# Patient Record
Sex: Male | Born: 1990 | Race: White | Hispanic: No | Marital: Married | Smoking: Never smoker
Health system: Southern US, Community
[De-identification: ages and names within clinical notes are randomized; demographics above are authoritative.]

## PROBLEM LIST (undated history)

## (undated) DIAGNOSIS — K529 Noninfective gastroenteritis and colitis, unspecified: Secondary | ICD-10-CM

## (undated) DIAGNOSIS — L80 Vitiligo: Secondary | ICD-10-CM

## (undated) HISTORY — PX: HERNIA REPAIR: SHX51

## (undated) HISTORY — DX: Noninfective gastroenteritis and colitis, unspecified: K52.9

## (undated) HISTORY — DX: Vitiligo: L80

## (undated) HISTORY — PX: KNEE SURGERY: SHX244

---

## 2016-08-28 ENCOUNTER — Emergency Department (HOSPITAL_COMMUNITY): Payer: BLUE CROSS/BLUE SHIELD

## 2016-08-28 ENCOUNTER — Encounter (HOSPITAL_COMMUNITY): Payer: Self-pay | Admitting: Emergency Medicine

## 2016-08-28 DIAGNOSIS — K219 Gastro-esophageal reflux disease without esophagitis: Secondary | ICD-10-CM | POA: Diagnosis not present

## 2016-08-28 DIAGNOSIS — R079 Chest pain, unspecified: Secondary | ICD-10-CM | POA: Diagnosis present

## 2016-08-28 LAB — CBC
HCT: 41.3 % (ref 39.0–52.0)
Hemoglobin: 14.7 g/dL (ref 13.0–17.0)
MCH: 29.7 pg (ref 26.0–34.0)
MCHC: 35.6 g/dL (ref 30.0–36.0)
MCV: 83.4 fL (ref 78.0–100.0)
PLATELETS: 254 10*3/uL (ref 150–400)
RBC: 4.95 MIL/uL (ref 4.22–5.81)
RDW: 11.6 % (ref 11.5–15.5)
WBC: 10.2 10*3/uL (ref 4.0–10.5)

## 2016-08-28 LAB — I-STAT TROPONIN, ED: TROPONIN I, POC: 0 ng/mL (ref 0.00–0.08)

## 2016-08-28 IMAGING — CR DG CHEST 2V
2 series · 2 of 2 positions shown · non-contrast
Comparison: None.

CLINICAL DATA: Chest pain

EXAM:
CHEST  2 VIEW

[chest pa]
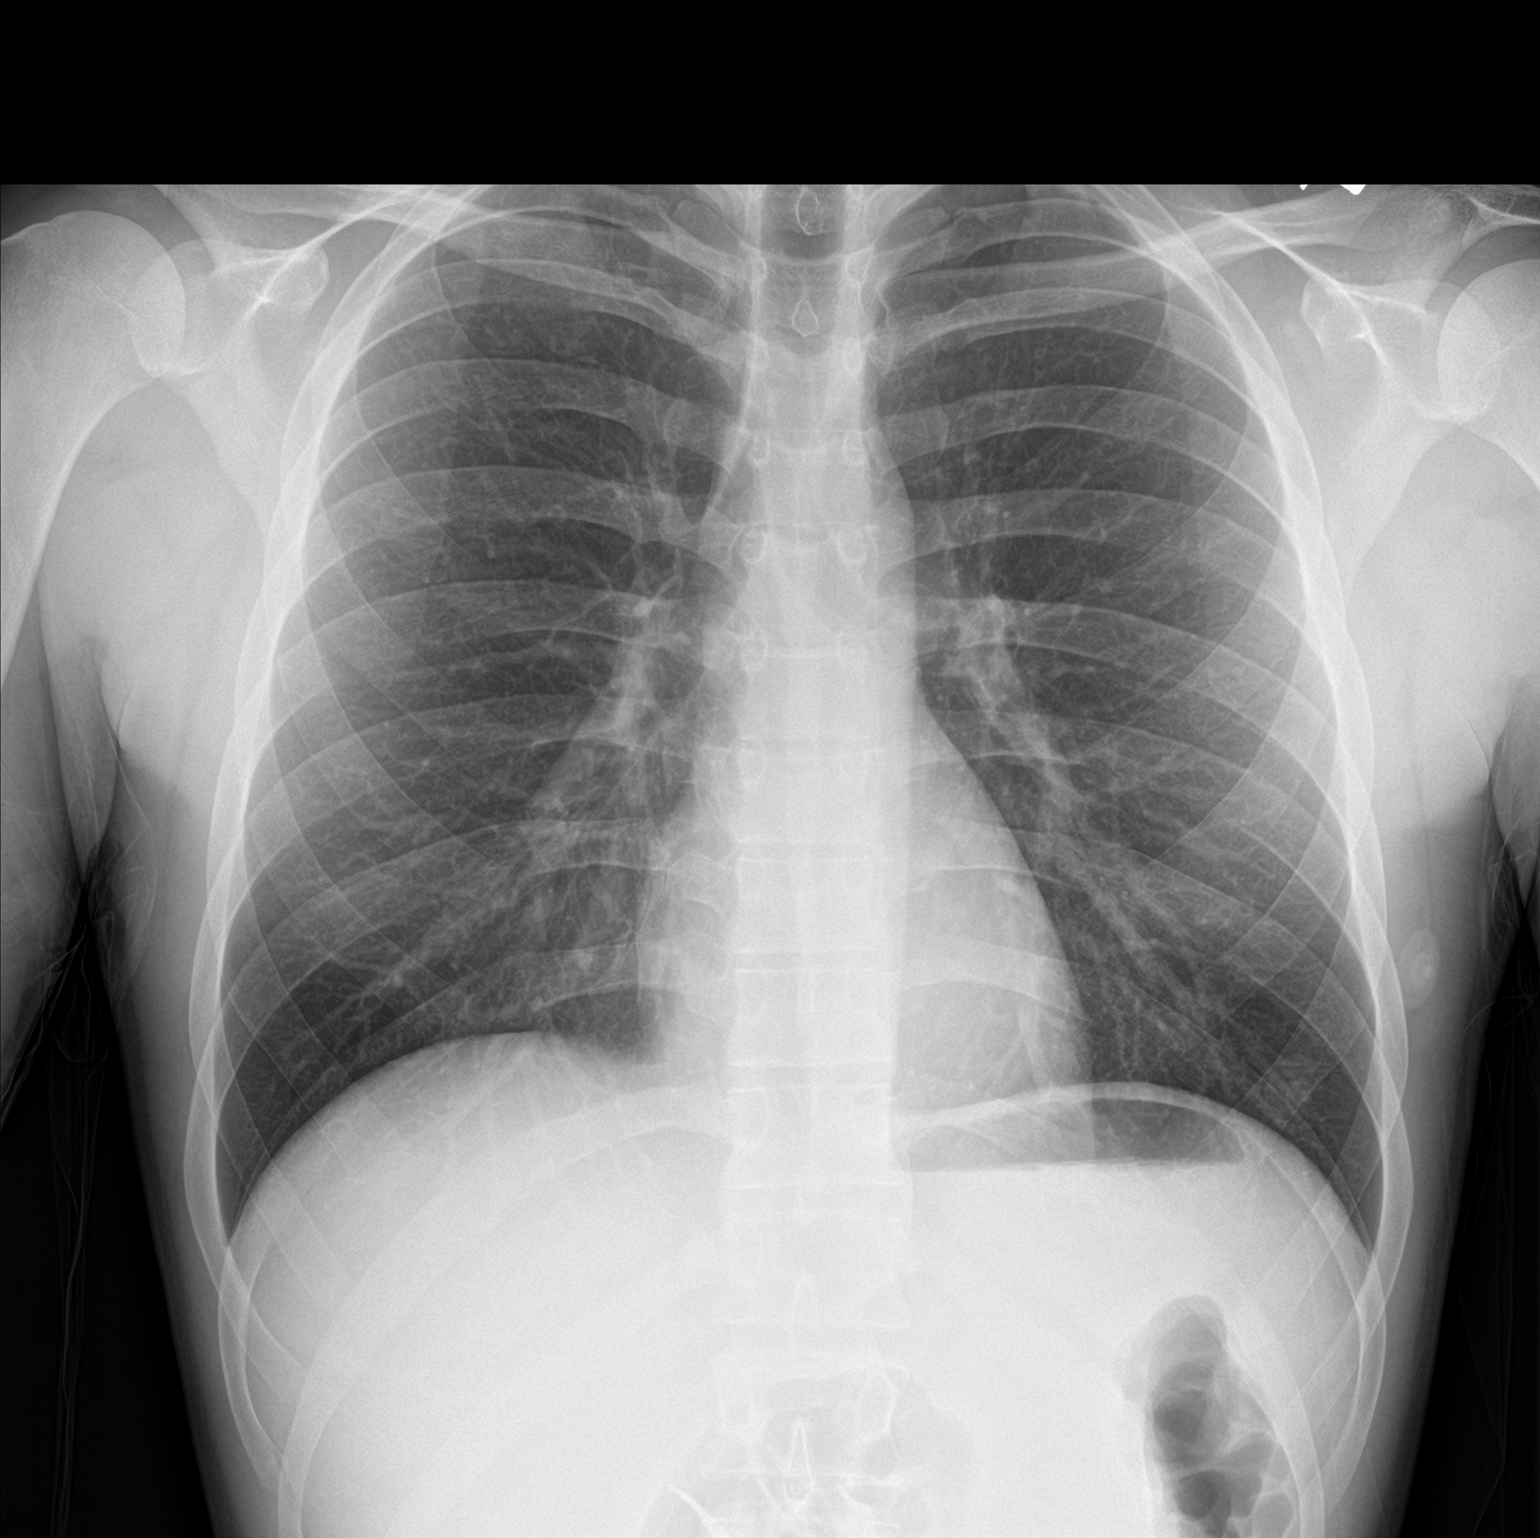

[chest lat]
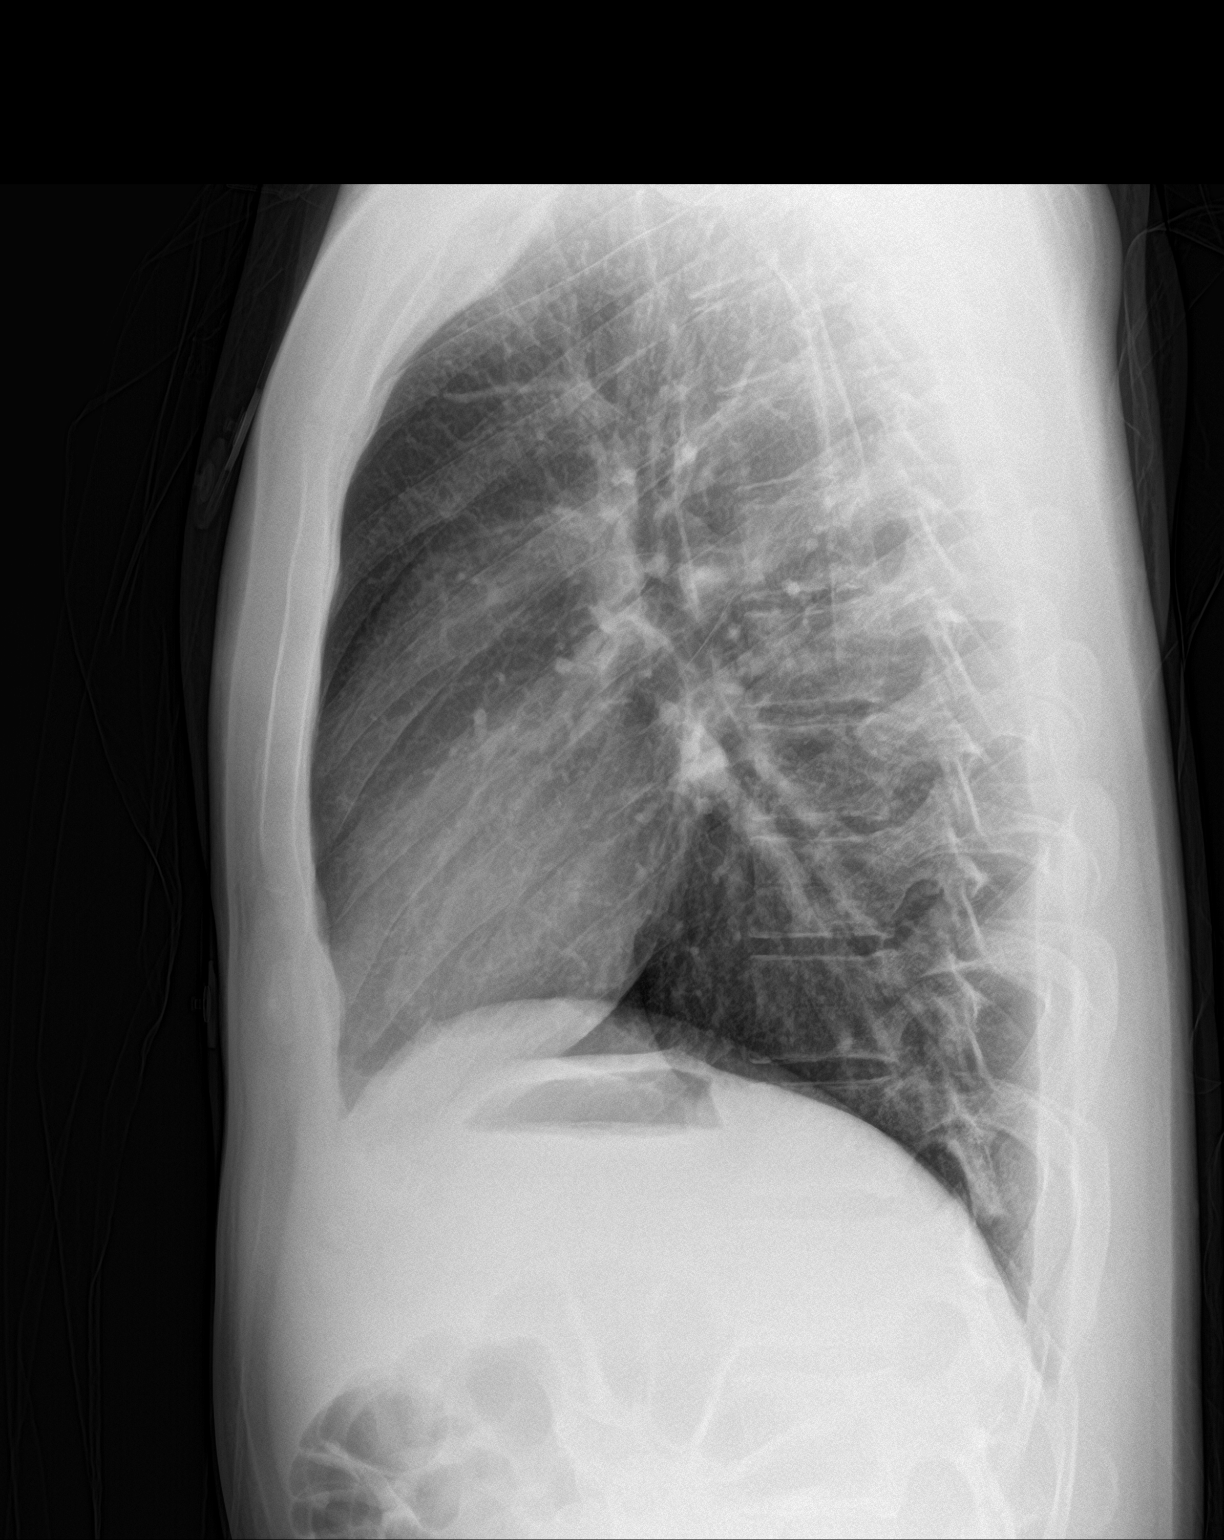

[2 of 2 positions shown; findings below may reference images not displayed]

FINDINGS: Cardiomediastinal contours are normal. No pneumothorax or pleural
effusion.

No focal airspace consolidation or pulmonary edema.
IMPRESSION: Clear lungs.

## 2016-08-28 NOTE — ED Triage Notes (Signed)
Patient reports left chest pain " burning" onset this evening with SOB and nausea , denies diaphoresis . No cough or fever .

## 2016-08-29 ENCOUNTER — Emergency Department (HOSPITAL_COMMUNITY)
Admission: EM | Admit: 2016-08-29 | Discharge: 2016-08-29 | Disposition: A | Discharge status: Home / Self Care | Payer: BLUE CROSS/BLUE SHIELD | Attending: Emergency Medicine | Admitting: Emergency Medicine

## 2016-08-29 DIAGNOSIS — R079 Chest pain, unspecified: Secondary | ICD-10-CM

## 2016-08-29 DIAGNOSIS — K219 Gastro-esophageal reflux disease without esophagitis: Secondary | ICD-10-CM

## 2016-08-29 LAB — BASIC METABOLIC PANEL
ANION GAP: 13 (ref 5–15)
BUN: 11 mg/dL (ref 6–20)
CALCIUM: 9.7 mg/dL (ref 8.9–10.3)
CO2: 26 mmol/L (ref 22–32)
Chloride: 96 mmol/L — ABNORMAL LOW (ref 101–111)
Creatinine, Ser: 1.01 mg/dL (ref 0.61–1.24)
GFR calc Af Amer: >60 mL/min (ref >60)
GFR calc non Af Amer: >60 mL/min (ref >60)
GLUCOSE: 103 mg/dL — AB (ref 65–99)
POTASSIUM: 3.7 mmol/L (ref 3.5–5.1)
Sodium: 135 mmol/L (ref 135–145)

## 2016-08-29 LAB — I-STAT TROPONIN, ED: Troponin i, poc: 0 ng/mL (ref 0.00–0.08)

## 2016-08-29 MED ORDER — ALUM & MAG HYDROXIDE-SIMETH 200-200-20 MG/5ML PO SUSP
30.0000 mL | Freq: Once | ORAL | Status: AC
  Administered 2016-08-29: 30 mL via ORAL
  Filled 2016-08-29: qty 30

## 2016-08-29 MED ORDER — OMEPRAZOLE 20 MG PO CPDR: 20.0000 mg | DELAYED_RELEASE_CAPSULE | Freq: Every day | ORAL | 0 refills | Status: DC

## 2016-08-29 NOTE — ED Provider Notes (Signed)
[facility] Provider Note   CSN: [id number] Arrival date & time: 08/28/16  2304     History   Chief Complaint Chief Complaint  Patient presents with  . Chest Pain    HPI Matthew Camacho is a 25 y.o. male.  HPI Patient reports constant burning left-sided chest pain since 9 PM.  He's never had symptoms like this before.  There is a family history of early cardiac disease but no one in their 220s had heart disease.  No history DVT or pulmonary embolism.  Patient reports no recent cough or congestion.  He states that his discomfort is worse with lying flat.  He tried Pepcid at home without improvement in his symptoms and could not kick comfortable through the night thus he came to the ER for evaluation.  He is active and walks everyday and never develops chest pain when walking.   History reviewed. No pertinent past medical history.  There are no active problems to display for this patient.   Past Surgical History:  Procedure Laterality Date  . HERNIA REPAIR         Home Medications    Prior to Admission medications   Not on File    Family History No family history on file.  Social History Social History  Substance Use Topics  . Smoking status: Never Smoker  . Smokeless tobacco: Never Used  . Alcohol use No     Allergies   Codeine   Review of Systems Review of Systems  All other systems reviewed and are negative.    Physical Exam Updated Vital Signs BP 146/84   Pulse 77   Temp 97.4 F (36.3 C) (Oral)   Resp 19   Ht 6' (1.829 m)   Wt 167 lb 8 oz (76 kg)   SpO2 100%   BMI 22.72 kg/m   Physical Exam  Constitutional: He is oriented to person, place, and time. He appears well-developed and well-nourished.  HENT:  Head: Normocephalic and atraumatic.  Eyes: EOM are normal.  Neck: Normal range of motion.  Cardiovascular: Normal rate, regular rhythm, normal heart sounds and intact distal pulses.   Pulmonary/Chest: Effort normal and breath  sounds normal. No respiratory distress.  Abdominal: Soft. He exhibits no distension. There is no tenderness.  Musculoskeletal: Normal range of motion.  Neurological: He is alert and oriented to person, place, and time.  Skin: Skin is warm and dry.  Psychiatric: He has a normal mood and affect. Judgment normal.  Nursing note and vitals reviewed.    ED Treatments / Results  Labs (all labs ordered are listed, but only abnormal results are displayed) Labs Reviewed  BASIC METABOLIC PANEL - Abnormal; Notable for the following:       Result Value   Chloride 96 (*)    Glucose, Bld 103 (*)    All other components within normal limits  CBC  I-STAT TROPOININ, ED  I-STAT TROPOININ, ED    EKG  EKG Interpretation #1  Date/Time:  Wednesday August 28 2016 23:08:43 EST Ventricular Rate:  67 PR Interval:  122 QRS Duration: 116 QT Interval:  368 QTC Calculation: 388 R Axis:   83 Text Interpretation:  Normal sinus rhythm Nonspecific ST abnormality Abnormal ECG No old tracing to compare Confirmed by Hanifa Antonetti  MD, Irineo Gaulin ([id number]) on 08/29/2016 5:05:20 AM        EKG Interpretation#2  Date/Time:  Thursday August 29 2016 05:13:41 EST Ventricular Rate:  61 PR Interval:  122 QRS Duration: 107 QT  Interval:  389 QTC Calculation: 392 R Axis:   82 Text Interpretation:  Sinus rhythm Atrial premature complexes in couplets RSR' in V1 or V2, probably normal variant Nonspecific T abnrm, anterolateral leads ST elev, probable normal early repol pattern No significant change was found Confirmed by Bonetta Mostek  MD, Ralph Benavidez ([id number]) on 08/29/2016 5:54:19 AM         Radiology Dg Chest 2 View  Result Date: 08/28/2016 CLINICAL DATA:  Chest pain EXAM: CHEST  2 VIEW COMPARISON:  None. FINDINGS: Cardiomediastinal contours are normal. No pneumothorax or pleural effusion. No focal airspace consolidation or pulmonary edema. IMPRESSION: Clear lungs. Electronically Signed   By: Deatra RobinsonKevin  Herman M.D.   On: 08/28/2016  23:35    Procedures Procedures (including critical care time)  Medications Ordered in ED Medications  alum & mag hydroxide-simeth (MAALOX/MYLANTA) 200-200-20 MG/5ML suspension 30 mL (30 mLs Oral Given 08/29/16 0509)     Initial Impression / Assessment and Plan / ED Course  I have reviewed the triage vital signs and the nursing notes.  Pertinent labs & imaging results that were available during my care of the patient were reviewed by me and considered in my medical decision making (see chart for details).  Clinical Course     Feels better after Maalox.  Patient be placed on daily Prilosec for a week.  Recommended Tums and Maalox at home for symptoms.  Doubt ACS.  Doubt PE.  Troponin negative 2.  EKG 2 without ischemic changes.  Patient is PERC negative  Final Clinical Impressions(s) / ED Diagnoses   Final diagnoses:  Chest pain, unspecified type  Gastroesophageal reflux disease, esophagitis presence not specified    New Prescriptions Discharge Medication List as of 08/29/2016  6:19 AM    START taking these medications   Details  omeprazole (PRILOSEC) 20 MG capsule Take 1 capsule (20 mg total) by mouth daily., Starting Thu 08/29/2016, Print         Azalia BilisKevin Dorwin Fitzhenry, MD 08/29/16 [id number]

## 2016-08-29 NOTE — ED Notes (Signed)
Pt verbalized understanding of d/c instructions and has no further questions. Pt is stable, A&Ox4, VSS.  

## 2018-02-16 ENCOUNTER — Encounter (HOSPITAL_COMMUNITY): Payer: Self-pay | Admitting: Emergency Medicine

## 2018-02-16 ENCOUNTER — Ambulatory Visit (HOSPITAL_COMMUNITY)
Admission: EM | Admit: 2018-02-16 | Discharge: 2018-02-16 | Disposition: A | Discharge status: Home / Self Care | Payer: Commercial Managed Care - PPO | Attending: Internal Medicine | Admitting: Internal Medicine

## 2018-02-16 DIAGNOSIS — Z885 Allergy status to narcotic agent status: Secondary | ICD-10-CM | POA: Diagnosis not present

## 2018-02-16 DIAGNOSIS — R197 Diarrhea, unspecified: Secondary | ICD-10-CM | POA: Diagnosis not present

## 2018-02-16 DIAGNOSIS — Z79899 Other long term (current) drug therapy: Secondary | ICD-10-CM | POA: Insufficient documentation

## 2018-02-16 LAB — CBC
HEMATOCRIT: 42.1 % (ref 39.0–52.0)
Hemoglobin: 14.3 g/dL (ref 13.0–17.0)
MCH: 29.3 pg (ref 26.0–34.0)
MCHC: 34 g/dL (ref 30.0–36.0)
MCV: 86.3 fL (ref 78.0–100.0)
PLATELETS: 330 10*3/uL (ref 150–400)
RBC: 4.88 MIL/uL (ref 4.22–5.81)
RDW: 11.7 % (ref 11.5–15.5)
WBC: 8 10*3/uL (ref 4.0–10.5)

## 2018-02-16 LAB — COMPREHENSIVE METABOLIC PANEL
ALBUMIN: 4 g/dL (ref 3.5–5.0)
ALT: 20 U/L (ref 17–63)
ANION GAP: 8 (ref 5–15)
AST: 18 U/L (ref 15–41)
Alkaline Phosphatase: 65 U/L (ref 38–126)
BILIRUBIN TOTAL: 0.7 mg/dL (ref 0.3–1.2)
BUN: 13 mg/dL (ref 6–20)
CHLORIDE: 103 mmol/L (ref 101–111)
CO2: 30 mmol/L (ref 22–32)
Calcium: 9.4 mg/dL (ref 8.9–10.3)
Creatinine, Ser: 1.19 mg/dL (ref 0.61–1.24)
GFR calc Af Amer: >60 mL/min (ref >60)
GFR calc non Af Amer: >60 mL/min (ref >60)
GLUCOSE: 92 mg/dL (ref 65–99)
POTASSIUM: 4.4 mmol/L (ref 3.5–5.1)
SODIUM: 141 mmol/L (ref 135–145)
Total Protein: 7 g/dL (ref 6.5–8.1)

## 2018-02-16 LAB — POCT I-STAT, CHEM 8
BUN: 15 mg/dL (ref 6–20)
CHLORIDE: 100 mmol/L — AB (ref 101–111)
Calcium, Ion: 1.27 mmol/L (ref 1.15–1.40)
Creatinine, Ser: 1.1 mg/dL (ref 0.61–1.24)
Glucose, Bld: 89 mg/dL (ref 65–99)
HCT: 41 % (ref 39.0–52.0)
HEMOGLOBIN: 13.9 g/dL (ref 13.0–17.0)
POTASSIUM: 4.4 mmol/L (ref 3.5–5.1)
SODIUM: 140 mmol/L (ref 135–145)
TCO2: 29 mmol/L (ref 22–32)

## 2018-02-16 LAB — C DIFFICILE QUICK SCREEN W PCR REFLEX
C DIFFICILE (CDIFF) INTERP: NOT DETECTED
C Diff antigen: NEGATIVE
C Diff toxin: NEGATIVE

## 2018-02-16 NOTE — ED Triage Notes (Signed)
Pt states that he has ongoing diarrhea for the past two weeks and that within the past couple of days it has gotten progressively worse.

## 2018-02-16 NOTE — ED Provider Notes (Signed)
[facility]    CSN: [id number] Arrival date & time: 02/16/18  1802     History   Chief Complaint Chief Complaint  Patient presents with  . Diarrhea    HPI Matthew Camacho is a 27 y.o. male.   Ed[place] presents with complaints of diarrhea which has been present for 2.5 weeks now. Has become more urgent. Has approximately 3-5 episodes a day of liquid stool. Over the past 3 days has had some bright red blood. States about 2 out of 3 stools have some blood. Abdominal pain at time of stool but otherwise no abdominal pain. Eating BRAT diet, drinking. No nausea or vomiting. No fever. Has had night sweats a few times. No recent travel or questionable intake. No recent surgery or antibiotic use. No known ill contacts. Wife does work in Teacher, music. Has been taking imodium which slows it down but has not stopped stooling. Denies any previous similar episodes. Hernia repair in the past. No other abdominal surgeries.    ROS per HPI.      History reviewed. No pertinent past medical history.  There are no active problems to display for this patient.   Past Surgical History:  Procedure Laterality Date  . HERNIA REPAIR         Home Medications    Prior to Admission medications   Medication Sig Start Date End Date Taking? Authorizing Provider  omeprazole (PRILOSEC) 20 MG capsule Take 1 capsule (20 mg total) by mouth daily. 08/29/16   Azalia Bilis, MD    Family History No family history on file.  Social History Social History   Tobacco Use  . Smoking status: Never Smoker  . Smokeless tobacco: Never Used  Substance Use Topics  . Alcohol use: No  . Drug use: No     Allergies   Codeine   Review of Systems Review of Systems   Physical Exam Triage Vital Signs ED Triage Vitals  Enc Vitals Group     BP 02/16/18 1911 111/61     Pulse Rate 02/16/18 1911 72     Resp 02/16/18 1911 20     Temp 02/16/18 1911 98.1 F (36.7 C)     Temp Source 02/16/18 1911 Oral       SpO2 02/16/18 1911 100 %     Weight --      Height --      Head Circumference --      Peak Flow --      Pain Score 02/16/18 1903 4     Pain Loc --      Pain Edu? --      Excl. in GC? --    No data found.  Updated Vital Signs BP 111/61 (BP Location: Left Arm)   Pulse 72   Temp 98.1 F (36.7 C) (Oral)   Resp 20   SpO2 100%    Physical Exam  Constitutional: He is oriented to person, place, and time. He appears well-developed and well-nourished.  Cardiovascular: Normal rate and regular rhythm.  Pulmonary/Chest: Effort normal and breath sounds normal.  Abdominal: Soft. He exhibits no distension. Bowel sounds are increased. There is no hepatosplenomegaly, splenomegaly or hepatomegaly. There is no tenderness. There is no rigidity, no rebound, no guarding, no CVA tenderness, no tenderness at McBurney's point and negative Murphy's sign.  Neurological: He is alert and oriented to person, place, and time.  Skin: Skin is warm and dry.     UC Treatments / Results  Labs (all labs ordered  are listed, but only abnormal results are displayed) Labs Reviewed  POCT I-STAT, CHEM 8 - Abnormal; Notable for the following components:      Result Value   Chloride 100 (*)    All other components within normal limits  C DIFFICILE QUICK SCREEN W PCR REFLEX  GASTROINTESTINAL PANEL BY PCR, STOOL (REPLACES STOOL CULTURE)  CBC  COMPREHENSIVE METABOLIC PANEL    EKG None  Radiology No results found.  Procedures Procedures (including critical care time)  Medications Ordered in UC Medications - No data to display  Initial Impression / Assessment and Plan / UC Course  I have reviewed the triage vital signs and the nursing notes.  Pertinent labs & imaging results that were available during my care of the patient were reviewed by me and considered in my medical decision making (see chart for details).     Non toxic in appearance. Afebrile. Without acute abdominal findings on exam.  Taking po, urinating. Recently with BRB to stool but no specific known risk factors for infectious diarrhea. Fissure? Stool culture obtained and cdiff. Cbc, cmp collected and pending. Opted to hold off on empiric antibiotics pending culture results at this time. Chem 8 reassuring. Will notify of any positive findings and if any changes to treatment are needed.  Encouraged follow up with GI. Return precautions provided. Patient verbalized understanding and agreeable to plan.   Final Clinical Impressions(s) / UC Diagnoses   Final diagnoses:  Diarrhea, unspecified type     Discharge Instructions     We will call you with any concerning or new findings, you may also check on your MyChart. Your Electrolytes look well today so as you said- you are maintaining hydration which is improving. Continue to drink plenty of fluids, BRAT diet as tolerated. May continue with imodium.  I would recommend establishing with GI no matter as results are still pending.  If develop worsening of pain, fevers, weakness, light headed or otherwise worsening please go to Er.      ED Prescriptions    None     Controlled Substance Prescriptions [place] Controlled Substance Registry consulted? Not Applicable   Georgetta HaberBurky, Price Lachapelle B, NP 02/16/18 2014

## 2018-02-16 NOTE — Discharge Instructions (Addendum)
We will call you with any concerning or new findings, you may also check on your MyChart. Your Electrolytes look well today so as you said- you are maintaining hydration which is improving. Continue to drink plenty of fluids, BRAT diet as tolerated. May continue with imodium.  I would recommend establishing with GI no matter as results are still pending.  If develop worsening of pain, fevers, weakness, light headed or otherwise worsening please go to Er.

## 2018-02-17 ENCOUNTER — Telehealth (HOSPITAL_COMMUNITY): Payer: Self-pay

## 2018-02-17 LAB — GASTROINTESTINAL PANEL BY PCR, STOOL (REPLACES STOOL CULTURE)
ADENOVIRUS F40/41: NOT DETECTED
ASTROVIRUS: NOT DETECTED
Campylobacter species: NOT DETECTED
Cryptosporidium: NOT DETECTED
Cyclospora cayetanensis: NOT DETECTED
ENTAMOEBA HISTOLYTICA: NOT DETECTED
ENTEROAGGREGATIVE E COLI (EAEC): NOT DETECTED
ENTEROTOXIGENIC E COLI (ETEC): NOT DETECTED
Enteropathogenic E coli (EPEC): NOT DETECTED
GIARDIA LAMBLIA: NOT DETECTED
NOROVIRUS GI/GII: NOT DETECTED
Plesimonas shigelloides: NOT DETECTED
Rotavirus A: NOT DETECTED
Salmonella species: NOT DETECTED
Sapovirus (I, II, IV, and V): NOT DETECTED
Shiga like toxin producing E coli (STEC): NOT DETECTED
Shigella/Enteroinvasive E coli (EIEC): NOT DETECTED
Vibrio cholerae: NOT DETECTED
Vibrio species: NOT DETECTED
Yersinia enterocolitica: NOT DETECTED

## 2018-02-17 NOTE — Telephone Encounter (Signed)
All results are within normal. Pt called and made aware and given instructions per Dorene GrebeNatalie NP.

## 2019-03-26 ENCOUNTER — Ambulatory Visit: Payer: Commercial Managed Care - PPO | Admitting: Family Medicine

## 2019-04-06 ENCOUNTER — Ambulatory Visit: Payer: Commercial Managed Care - PPO | Admitting: Family Medicine

## 2019-04-06 ENCOUNTER — Encounter: Payer: Self-pay | Admitting: Family Medicine

## 2019-04-06 ENCOUNTER — Other Ambulatory Visit: Payer: Self-pay

## 2019-04-06 VITALS — BP 110/62 | HR 62 | Ht 72.0 in | Wt 157.0 lb

## 2019-04-06 DIAGNOSIS — K529 Noninfective gastroenteritis and colitis, unspecified: Secondary | ICD-10-CM | POA: Diagnosis not present

## 2019-04-06 DIAGNOSIS — Z0000 Encounter for general adult medical examination without abnormal findings: Secondary | ICD-10-CM | POA: Diagnosis not present

## 2019-04-06 NOTE — Assessment & Plan Note (Signed)
Currently well controlled.  Ambulatory referral to GI placed today.  Patient does not need any refills of his Apriso.  Answered patient's questions about biologic such as Humira and discussed that they work well but do have side effects such as immunosuppression, so they are usually reserved for more severe disease, but I advised him that I would defer to his GI doctor regarding these medications.

## 2019-04-06 NOTE — Progress Notes (Signed)
Subjective:    Matthew Camacho - 28 y.o. male MRN [id number]  Date of birth: 1991/06/11  CC:  Bradyn Soward is here to establish care.  He would also like to discuss referral to GI for management of his colitis.  HPI: Was initially diagnosed with unspecified colitis in June 2019 after having a flare of bright red blood per rectum and diarrhea for about a month.  He was referred to [facility] gastro[facility]ology in [place], and he had a sigmoidoscopy which showed colitis but was unable to determine what type of colitis it was.  He was placed on Apriso at that point, and he has tolerated this medication well.  He had a minor flare from May to June 2020 but did not have bright red blood per rectum at that time.  He did have some fecal urgency each morning which has since improved.  He has lost about 15 pounds since his initial diagnosis but has remained steady at that weight since.  He and his wife's have adjusted their diet to try to reduce his symptoms.  He would like a GI referral in [place] now that he has moved back to the area.  PMH: unspecified colitis, vitiligo  Meds: Apriso, no refills needed currently  FH: MGF - diabetes PGF - vitiligo  Surgical Hx: inguinal hernia repair in 2017  Social Hx: enjoys walking his dogs 2-3 miles 4 times per week, strength training 1-2 times per week, social alcohol use about 1 per day, no cigarette smoking or other drug use.  His wife is in OT at [employer] and is currently [redacted] weeks pregnant.   Health Maintenance:  He is at very low risk for HIV, so testing was referred today.  He received a tetanus shot in 2018.  He was encouraged to get his flu vaccine in September or October. Health Maintenance Due  Topic Date Due  . TETANUS/TDAP  07/25/2011  . INFLUENZA VACCINE  04/03/2019    -  reports that he has never smoked. He has never used smokeless tobacco. Review of Systems  Constitutional: Negative for malaise/fatigue and weight loss.  HENT:  Negative for hearing loss.   Eyes: Negative for blurred vision.  Respiratory: Negative for cough.   Cardiovascular: Negative for chest pain.  Gastrointestinal: Negative for abdominal pain.  Musculoskeletal: Negative for myalgias.  Psychiatric/Behavioral: The patient is not nervous/anxious.     - Past Medical History: Patient Active Problem List   Diagnosis Date Noted  . Colitis 04/06/2019  . Healthcare maintenance 04/06/2019   - Medications: reviewed and updated   Objective:   Physical Exam BP 110/62   Pulse 62   Ht 6' (1.829 m)   Wt 157 lb (71.2 kg)   SpO2 99%   BMI 21.29 kg/m  Gen: NAD, alert, cooperative with exam, well-appearing HEENT: NCAT, PERRL, clear conjunctiva, TMs clear bilaterally, supple neck CV: RRR, good S1/S2, no murmur, no edema, capillary refill brisk  Resp: CTABL, no wheezes, non-labored Abd: SNTND, BS present, no guarding or organomegaly Skin:  Neuro: no gross deficits.  Psych: good insight, alert and oriented        Assessment & Plan:   Colitis Currently well controlled.  Ambulatory referral to GI placed today.  Patient does not need any refills of his Apriso.  Answered patient's questions about biologic such as Humira and discussed that they work well but do have side effects such as immunosuppression, so they are usually reserved for more severe disease, but I advised  him that I would defer to his GI doctor regarding these medications.  Healthcare maintenance Overall healthy individual with normal physical exam and vital signs.  Up-to-date on healthcare maintenance, including Tdap.  No labs needed today.  He may need colon cancer screening earlier than most individuals due to his colitis, but [facility].    Lezlie OctaveAmanda Lehua Flores, M.D. 04/06/2019, 9:15 AM PGY-3, [facility]

## 2019-04-06 NOTE — Assessment & Plan Note (Addendum)
Overall healthy individual with normal physical exam and vital signs.  Up-to-date on healthcare maintenance, including Tdap.  No labs needed today.  He may need colon cancer screening earlier than most individuals due to his colitis, but will defer to [facility] management regarding this issue.

## 2019-04-06 NOTE — Patient Instructions (Signed)
It was nice meeting you today Matthew Camacho!  I have referred you to a gastroenterology office here in [place].  If you do not hear from them in the next 2 weeks, please give [place] a call.  Make sure to get your flu shot in September or October.  You can see me again in about 1 year or earlier if you need to.  Please feel free to call anytime.  If you have any questions or concerns, please feel free to call the clinic.   Be well,  Dr. Shan Levans

## 2019-04-07 ENCOUNTER — Encounter: Payer: Self-pay | Admitting: Family Medicine

## 2019-04-07 ENCOUNTER — Telehealth: Payer: Self-pay | Admitting: Gastroenterology

## 2019-04-07 NOTE — Telephone Encounter (Signed)
Yes I will

## 2019-04-07 NOTE — Telephone Encounter (Signed)
DOD April 06, 2019  Dr. Loletha Carrow, there is a referral for this Matthew Camacho for colitis.  Matthew Camacho was seen in 2019 at [facility] in [place] (in [place] for review.)  Matthew Camacho requested to transfer to [facility] due to proximity.  Will you accept this Matthew Camacho?

## 2019-05-06 ENCOUNTER — Other Ambulatory Visit (INDEPENDENT_AMBULATORY_CARE_PROVIDER_SITE_OTHER): Payer: Commercial Managed Care - PPO

## 2019-05-06 ENCOUNTER — Ambulatory Visit: Payer: Commercial Managed Care - PPO | Admitting: Gastroenterology

## 2019-05-06 ENCOUNTER — Other Ambulatory Visit: Payer: Self-pay

## 2019-05-06 ENCOUNTER — Encounter: Payer: Self-pay | Admitting: Gastroenterology

## 2019-05-06 VITALS — BP 120/78 | HR 67 | Temp 98.7°F | Ht 72.0 in | Wt 157.2 lb

## 2019-05-06 DIAGNOSIS — K5190 Ulcerative colitis, unspecified, without complications: Secondary | ICD-10-CM

## 2019-05-06 LAB — CBC WITH DIFFERENTIAL/PLATELET
Basophils Absolute: 0.1 10*3/uL (ref 0.0–0.1)
Basophils Relative: 1.7 % (ref 0.0–3.0)
Eosinophils Absolute: 0.1 10*3/uL (ref 0.0–0.7)
Eosinophils Relative: 2 % (ref 0.0–5.0)
HCT: 43.6 % (ref 39.0–52.0)
Hemoglobin: 15.4 g/dL (ref 13.0–17.0)
Lymphocytes Relative: 24.9 % (ref 12.0–46.0)
Lymphs Abs: 1.5 10*3/uL (ref 0.7–4.0)
MCHC: 35.3 g/dL (ref 30.0–36.0)
MCV: 86.7 fl (ref 78.0–100.0)
Monocytes Absolute: 0.4 10*3/uL (ref 0.1–1.0)
Monocytes Relative: 6.1 % (ref 3.0–12.0)
Neutro Abs: 4 10*3/uL (ref 1.4–7.7)
Neutrophils Relative %: 65.3 % (ref 43.0–77.0)
Platelets: 234 10*3/uL (ref 150.0–400.0)
RBC: 5.03 Mil/uL (ref 4.22–5.81)
RDW: 12 % (ref 11.5–15.5)
WBC: 6.2 10*3/uL (ref 4.0–10.5)

## 2019-05-06 LAB — COMPREHENSIVE METABOLIC PANEL
ALT: 21 U/L (ref 0–53)
AST: 19 U/L (ref 0–37)
Albumin: 4.7 g/dL (ref 3.5–5.2)
Alkaline Phosphatase: 70 U/L (ref 39–117)
BUN: 11 mg/dL (ref 6–23)
CO2: 33 mEq/L — ABNORMAL HIGH (ref 19–32)
Calcium: 9.9 mg/dL (ref 8.4–10.5)
Chloride: 102 mEq/L (ref 96–112)
Creatinine, Ser: 0.91 mg/dL (ref 0.40–1.50)
GFR: 100.1 mL/min (ref >60.00)
Glucose, Bld: 59 mg/dL — ABNORMAL LOW (ref 70–99)
Potassium: 4.2 mEq/L (ref 3.5–5.1)
Sodium: 141 mEq/L (ref 135–145)
Total Bilirubin: 1.3 mg/dL — ABNORMAL HIGH (ref 0.2–1.2)
Total Protein: 7.5 g/dL (ref 6.0–8.3)

## 2019-05-06 LAB — SEDIMENTATION RATE: Sed Rate: 2 mm/hr (ref 0–15)

## 2019-05-06 MED ORDER — NA SULFATE-K SULFATE-MG SULF 17.5-3.13-1.6 GM/177ML PO SOLN: 1.0000 | Freq: Once | ORAL | 0 refills | Status: AC

## 2019-05-06 NOTE — Progress Notes (Signed)
[facility] Consult Note:  History: Matthew Camacho 05/06/2019  Referring provider: Kathrene Alu, MD  Reason for consult/chief complaint: Establish Care (Wants to establish care and has questions about his colitis)   Subjective  HPI: This is a very pleasant 28 year old man referred by primary care to establish with a new GI practice for his colitis.  In the spring 2019 he was having up to 25-30 BMs per day with urgency and bleeding.  He saw Dr. Kathryne Hitch in [place], and a same-day unprepped sigmoidoscopy was performed to the rectosigmoid junction.  There was active inflammation, and biopsies showed moderately active chronic colitis. [name] was treated with a prednisone course and then Apriso, 2 capsules twice daily.  C. difficile and other infectious testing were negative at that time.  CBC was normal, CRP elevated at 18. [name] does not recall having skin rash joint swelling or painful eye redness at the time of diagnosis, and has not had any since then.  He has remained on the same dose of a Pres O and is generally feeling well.  He typically has 2 or 3 soft to formed nonbloody BMs with the first few hours after awakening, and often no more BM the rest of the day.  He denies rectal bleeding, his appetite is good, his weight stable, and he denies abdominal pain.  There is no family history of IBD to his knowledge.  ROS:  Review of Systems  Constitutional: Negative for appetite change and unexpected weight change.  HENT: Negative for mouth sores and voice change.   Eyes: Negative for pain and redness.  Respiratory: Negative for cough and shortness of breath.   Cardiovascular: Negative for chest pain and palpitations.  Genitourinary: Negative for dysuria and hematuria.  Musculoskeletal: Negative for arthralgias and myalgias.  Skin: Negative for pallor and rash.  Neurological: Negative for weakness and headaches.  Hematological: Negative for adenopathy.      Past Medical History: Past Medical History:  Diagnosis Date  . Colitis      Past Surgical History: Past Surgical History:  Procedure Laterality Date  . HERNIA REPAIR       Family History: Family History  Problem Relation Age of Onset  . Diabetes Maternal Grandfather   . Vitiligo Paternal Grandfather     Social History: Social History   Socioeconomic History  . Marital status: Married    Spouse name: Not on file  . Number of children: Not on file  . Years of education: Not on file  . Highest education level: Not on file  Occupational History  . Not on file  Social Needs  . Financial resource strain: Not on file  . Food insecurity    Worry: Not on file    Inability: Not on file  . Transportation needs    Medical: Not on file    Non-medical: Not on file  Tobacco Use  . Smoking status: Never Smoker  . Smokeless tobacco: Never Used  Substance and Sexual Activity  . Alcohol use: Yes    Alcohol/week: 7.0 standard drinks    Types: 7 Standard drinks or equivalent per week  . Drug use: No  . Sexual activity: Yes  Lifestyle  . Physical activity    Days per week: Not on file    Minutes per session: Not on file  . Stress: Not on file  Relationships  . Social Herbalist on phone: Not on file    Gets together: Not on file  Attends religious service: Not on file    Active member of club or organization: Not on file    Attends meetings of clubs or organizations: Not on file    Relationship status: Not on file  Other Topics Concern  . Not on file  Social History Narrative  . Not on file   He is a Government social research officer for an [employer]  Allergies: No Known Allergies  Outpatient Meds: Current Outpatient Medications  Medication Sig Dispense Refill  . mesalamine (APRISO) 0.375 g 24 hr capsule Take 375 mg by mouth 2 (two) times daily. Takes two twice daily    . Na Sulfate-K Sulfate-Mg Sulf 17.5-3.13-1.6 GM/177ML SOLN Take 1 kit by mouth once  for 1 dose. 354 mL 0   No current facility-administered medications for this visit.       ___________________________________________________________________ Objective   Exam:  BP 120/78 (BP Location: Left Arm, Patient Position: Sitting)   Pulse 67   Temp 98.7 F (37.1 C) (Temporal)   Ht 6' (1.829 m)   Wt 157 lb 3.2 oz (71.3 kg)   SpO2 97%   BMI 21.32 kg/m    General: Well-appearing  Eyes: sclera anicteric, no redness  ENT: oral mucosa moist without lesions, no cervical or supraclavicular lymphadenopathy  CV: RRR without murmur, S1/S2, no JVD, no peripheral edema  Resp: clear to auscultation bilaterally, normal RR and effort noted  GI: soft, no tenderness, with active bowel sounds. No guarding or palpable organomegaly noted.  Skin; warm and dry, no rash or jaundice noted  Neuro: awake, alert and oriented x 3. Normal gross motor function and fluent speech  Labs:  Data as noted above and on file. He has had no labs for a year.  Assessment: Encounter Diagnosis  Name Primary?  . 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  He had a limited initial evaluation, so we do not know the extent of colitis at the time of diagnosis.  We discussed the nature of IBD, its variable course, and the need for endoscopic reevaluation to assess activity and therefore optimize therapy.  Plan:  Colonoscopy.  He is agreeable after discussion of procedure and risks.  The benefits and risks of the planned procedure were described in detail with the patient or (when appropriate) their health care proxy.  Risks were outlined as including, but not limited to, bleeding, infection, perforation, adverse medication reaction leading to cardiac or pulmonary decompensation.  The limitation of incomplete mucosal visualization was also discussed.  No guarantees or warranties were given.  Daily  calcium and vitamin D supplement  CBC, CMP especially to check renal function for the uncommon chance of nephritis from mesalamine.,  ESR is a baseline when he is feeling well.   Thank you for the courtesy of this consult.  Please call me with any questions or concerns.  Nelida Meuse III  CC: Referring provider noted above

## 2019-05-06 NOTE — Patient Instructions (Signed)
If you are age 27 or older, your body mass index should be between 23-30. Your Body mass index is 21.32 kg/m. If this is out of the aforementioned range listed, please consider follow up with your Primary Care Provider.  If you are age 32 or younger, your body mass index should be between 19-25. Your Body mass index is 21.32 kg/m. If this is out of the aformentioned range listed, please consider follow up with your Primary Care Provider.   You have been scheduled for a colonoscopy. Please follow written instructions given to you at your visit today.  Please pick up your prep supplies at the pharmacy within the next 1-3 days. If you use inhalers (even only as needed), please bring them with you on the day of your procedure. Your physician has requested that you go to www.startemmi.com and enter the access code given to you at your visit today. This web site gives a general overview about your procedure. However, you should still follow specific instructions given to you by our office regarding your preparation for the procedure.  Your provider has requested that you go to the basement level for lab work before leaving today. Press "B" on the elevator. The lab is located at the first door on the left as you exit the elevator.  It was a pleasure to see you today!  Dr. Loletha Carrow

## 2019-05-18 ENCOUNTER — Encounter: Payer: Self-pay | Admitting: Gastroenterology

## 2019-05-21 ENCOUNTER — Other Ambulatory Visit: Payer: Self-pay | Admitting: Gastroenterology

## 2019-05-21 ENCOUNTER — Other Ambulatory Visit: Payer: Self-pay

## 2019-05-21 ENCOUNTER — Ambulatory Visit (AMBULATORY_SURGERY_CENTER): Payer: Commercial Managed Care - PPO | Admitting: Gastroenterology

## 2019-05-21 ENCOUNTER — Encounter: Payer: Self-pay | Admitting: Gastroenterology

## 2019-05-21 VITALS — BP 102/72 | HR 56 | Temp 98.0°F | Resp 16 | Ht 72.0 in | Wt 157.0 lb

## 2019-05-21 DIAGNOSIS — K5190 Ulcerative colitis, unspecified, without complications: Secondary | ICD-10-CM

## 2019-05-21 MED ORDER — SODIUM CHLORIDE 0.9 % IV SOLN: 500.0000 mL | Freq: Once | INTRAVENOUS | Status: DC

## 2019-05-21 NOTE — Op Note (Signed)
[facility] Patient Name: Matthew Camacho Procedure Date: 05/21/2019 7:50 AM MRN: [id number] Endoscopist: [place]. Loletha Carrow , MD Age: 28 Referring MD:  Date of Birth: 03/23/1991 Gender: Male Account #: [id number] Procedure:                Colonoscopy Indications:              Chronic ulcerative proctosigmoiditis (Dx about 18                            months ago at outside practice by in-office scope                            advanced to rectosigmoid junction. Rx with                            prednisone course followed by apriso, whic hhas                            maintained good control since then). Exam today to                            assess extent and degree of macro or microscopic                            activity. Recent CBC,CMP and ESR normal Medicines:                Monitored Anesthesia Care Procedure:                Pre-Anesthesia Assessment:                           - Prior to the procedure, a History and Physical                            was performed, and patient medications and                            allergies were reviewed. The patient's tolerance of                            previous anesthesia was also reviewed. The risks                            and benefits of the procedure and the sedation                            options and risks were discussed with the patient.                            All questions were answered, and informed consent                            was obtained. Prior Anticoagulants: The patient has  taken no previous anticoagulant or antiplatelet                            agents. ASA Grade Assessment: II - A patient with                            mild systemic disease. After reviewing the risks                            and benefits, the patient was deemed in                            satisfactory condition to undergo the procedure.                           After obtaining informed  consent, the colonoscope                            was passed under direct vision. Throughout the                            procedure, the patient's blood pressure, pulse, and                            oxygen saturations were monitored continuously. The                            Colonoscope was introduced through the anus and                            advanced to the the terminal ileum, with                            identification of the appendiceal orifice and IC                            valve. The colonoscopy was performed without                            difficulty. The patient tolerated the procedure                            well. The quality of the bowel preparation was                            excellent. The terminal ileum, ileocecal valve,                            appendiceal orifice, and rectum were photographed. Scope In: 8:14:38 AM Scope Out: 8:25:25 AM Scope Withdrawal Time: 0 hours 7 minutes 21 seconds  Total Procedure Duration: 0 hours 10 minutes 47 seconds  Findings:                 The perianal and digital rectal examinations were  normal.                           The terminal ileum appeared normal.                           Normal mucosa was found in the entire colon. Three                            biopsies each were obtained in the rectum (Jar 3),                            in the sigmoid colon(Jar 2) and in the ascending                            colon (Jar 1) with cold forceps for histology.                           The retroflexed view of the distal rectum and anal                            verge was normal and showed no anal or rectal                            abnormalities. Complications:            No immediate complications. Estimated Blood Loss:     Estimated blood loss was minimal. Impression:               - The examined portion of the ileum was normal.                           - Normal mucosa in the entire  examined colon.                           - The distal rectum and anal verge are normal on                            retroflexion view.                           - Three biopsies were obtained in the rectum, in                            the sigmoid colon and in the ascending colon. Recommendation:           - Patient has a contact number available for                            emergencies. The signs and symptoms of potential                            delayed complications were discussed with the  patient. Return to normal activities tomorrow.                            Written discharge instructions were provided to the                            patient.                           - Resume previous diet.                           - Continue present medications.                           - Await pathology results.                           - Return to my office in 1 year.                           - No recommendation at this time regarding repeat                            colonoscopy. Henry L. Loletha Carrow, MD 05/21/2019 8:36:12 AM This report has been signed electronically.

## 2019-05-21 NOTE — Patient Instructions (Signed)
YOU HAD AN ENDOSCOPIC PROCEDURE TODAY AT THE [facility]:   Refer to the procedure report that was given to you for any specific questions about what was found during the examination.  If the procedure report does not answer your questions, please call your gastroenterologist to clarify.  If you requested that your care partner not be given the details of your procedure findings, then the procedure report has been included in a sealed envelope for you to review at your convenience later.  YOU SHOULD EXPECT: Some feelings of bloating in the abdomen. Passage of more gas than usual.  Walking can help get rid of the air that was put into your GI tract during the procedure and reduce the bloating. If you had a lower endoscopy (such as a colonoscopy or flexible sigmoidoscopy) you may notice spotting of blood in your stool or on the toilet paper. If you underwent a bowel prep for your procedure, you may not have a normal bowel movement for a few days.  Please Note:  You might notice some irritation and congestion in your nose or some drainage.  This is from the oxygen used during your procedure.  There is no need for concern and it should clear up in a day or so.  SYMPTOMS TO REPORT IMMEDIATELY:   Following lower endoscopy (colonoscopy or flexible sigmoidoscopy):  Excessive amounts of blood in the stool  Significant tenderness or worsening of abdominal pains  Swelling of the abdomen that is new, acute  Fever of 100F or higher  For urgent or emergent issues, a gastroenterologist can be reached at any hour by calling [phone].   DIET:  We do recommend a small meal at first, but then you may proceed to your regular diet.  Drink plenty of fluids but you should avoid alcoholic beverages for 24 hours.  ACTIVITY:  You should plan to take it easy for the rest of today and you should NOT DRIVE or use heavy machinery until tomorrow (because of the sedation medicines used during the test).     FOLLOW UP: Our staff will call the number listed on your records 48-72 hours following your procedure to check on you and address any questions or concerns that you may have regarding the information given to you following your procedure. If we do not reach you, we will leave a message.  We will attempt to reach you two times.  During this call, we will ask if you have developed any symptoms of COVID 19. If you develop any symptoms (ie: fever, flu-like symptoms, shortness of breath, cough etc.) before then, please call [phone].  If you test positive for Covid 19 in the 2 weeks post procedure, please call and report this information to us.    If any biopsies were taken you will be contacted by phone or by letter within the next 1-3 weeks.  Please call us at [phone] if you have not heard about the biopsies in 3 weeks.    SIGNATURES/CONFIDENTIALITY: You and/or your care partner have signed paperwork which will be entered into your electronic medical record.  These signatures attest to the fact that that the information above on your After Visit Summary has been reviewed and is understood.  Full responsibility of the confidentiality of this discharge information lies with you and/or your care-partner. 

## 2019-05-21 NOTE — Progress Notes (Signed)
Report given to PACU, vss 

## 2019-05-21 NOTE — Progress Notes (Signed)
Called to room to assist during endoscopic procedure.  Patient ID and intended procedure confirmed with present staff. Received instructions for my participation in the procedure from the performing physician.  

## 2019-05-21 NOTE — Progress Notes (Signed)
Pt's states no medical or surgical changes since previsit or office visit.  Temp KA VS CW 

## 2019-05-24 ENCOUNTER — Other Ambulatory Visit: Payer: Self-pay | Admitting: *Deleted

## 2019-05-25 ENCOUNTER — Telehealth: Payer: Self-pay | Admitting: *Deleted

## 2019-05-25 NOTE — Telephone Encounter (Signed)
1. Have you developed a fever since your procedure? no  2.   Have you had an respiratory symptoms (SOB or cough) since your procedure? no  3.   Have you tested positive for COVID 19 since your procedure no  4.   Have you had any family members/close contacts diagnosed with the COVID 19 since your procedure?  no   If yes to any of these questions please route to Joylene John, RN and Alphonsa Gin, Therapist, sports.  Follow up Call-  Call back number 05/21/2019  Post procedure Call Back phone  # [phone]  Permission to leave phone message Yes  Some recent data might be hidden     Patient questions:  Do you have a fever, pain , or abdominal swelling? No. Pain Score  0 *  Have you tolerated food without any problems? Yes.    Have you been able to return to your normal activities? Yes.    Do you have any questions about your discharge instructions: Diet   No. Medications  No. Follow up visit  No.  Do you have questions or concerns about your Care? No.  Actions: * If pain score is 4 or above: No action needed, pain <4.

## 2019-06-01 ENCOUNTER — Encounter: Payer: Self-pay | Admitting: Gastroenterology

## 2019-06-07 ENCOUNTER — Telehealth: Payer: Self-pay | Admitting: Gastroenterology

## 2019-06-07 MED ORDER — MESALAMINE ER 0.375 G PO CP24: 750.0000 mg | ORAL_CAPSULE | Freq: Two times a day (BID) | ORAL | 11 refills | Status: DC

## 2019-06-07 NOTE — Telephone Encounter (Signed)
Pt needs prescription for Apriso sent to [place] at [facility]. His pharmacy told hm that they faxed request last week.

## 2019-06-07 NOTE — Telephone Encounter (Signed)
Last seen 05-06-2019. Last TB screen 03-2018. Please advise on refills.

## 2019-06-07 NOTE — Telephone Encounter (Signed)
Updated prescription sent to his pharmacy  No TB testing is necessary, as he is not on immune modulator or biologic therapy.

## 2019-07-04 ENCOUNTER — Encounter: Payer: Self-pay | Admitting: Family Medicine

## 2019-07-05 ENCOUNTER — Encounter: Payer: Self-pay | Admitting: Family Medicine

## 2019-10-04 ENCOUNTER — Other Ambulatory Visit: Payer: Self-pay | Admitting: Gastroenterology

## 2019-10-04 MED ORDER — MESALAMINE 1000 MG RE SUPP: 1000.0000 mg | Freq: Every day | RECTAL | 1 refills | Status: DC

## 2020-02-04 ENCOUNTER — Encounter: Payer: Commercial Managed Care - PPO | Admitting: Family Medicine

## 2020-02-14 ENCOUNTER — Other Ambulatory Visit: Payer: Self-pay

## 2020-02-14 ENCOUNTER — Encounter: Payer: Self-pay | Admitting: Family Medicine

## 2020-02-14 ENCOUNTER — Ambulatory Visit (INDEPENDENT_AMBULATORY_CARE_PROVIDER_SITE_OTHER): Payer: Commercial Managed Care - PPO | Admitting: Family Medicine

## 2020-02-14 DIAGNOSIS — Z0000 Encounter for general adult medical examination without abnormal findings: Secondary | ICD-10-CM | POA: Diagnosis not present

## 2020-02-14 NOTE — Assessment & Plan Note (Addendum)
Mr. Kelly continues to do well, and ulcerative colitis is well controlled.  Blood pressure is at goal.  STI screening is not indicated as he is monogamous with his wife.  Hepatitis C screening was offered, which he declined.  He is at low risk for hepatitis C infection.  He does not need any other labs today.  He will continue to follow-up with GI regarding his ulcerative colitis.  He will follow-up with [place] in 1 year or earlier if needed.

## 2020-02-14 NOTE — Patient Instructions (Addendum)
It was nice seeing you today Matthew Camacho!  Continue doing an excellent job taking care of your health.  We will see you back in one year or earlier if needed.  If you have any questions or concerns, please feel free to call the clinic.   Be well,  Dr. Frances Furbish

## 2020-02-14 NOTE — Progress Notes (Signed)
    SUBJECTIVE:   CHIEF COMPLAINT / HPI:   Annual examination  Matthew Camacho is a 29 y.o. who presents today for an annual exam.   Reviewed and updated history: Patient reports that his ulcerative colitis is well controlled.  His last flare was about 6 months ago.  He continues on Apriso daily with [facility] for flares.  He has no other chronic health conditions and takes no other medications.  Review of systems form notable for no diarrhea, no weight loss, no fevers.  He denies symptoms of depression or anxiety.  He denies tobacco use and only occasionally drinks alcohol.  He exercises 2-3 times per week and eats a varied diet that is conducive to his ulcerative colitis management.  He recently welcomed a daughter, who was born in February.  He says that she and his wife are doing well.  He has received the Covid vaccine.   OBJECTIVE:   BP 108/64   Pulse 71   Ht 6' (1.829 m)   Wt 165 lb 3.2 oz (74.9 kg)   SpO2 96%   BMI 22.41 kg/m   General: well appearing, appears stated age HEENT: Dentition is in good condition, clear oropharynx, tympanic membranes clear bilaterally, no cervical lymphadenopathy Cardiac: RRR, no MRG Respiratory: CTAB, no rhonchi, rales, or wheezing, normal work of breathing Skin: no rashes or other lesions, warm and well perfused Psych: appropriate mood and affect  ASSESSMENT/PLAN:   Healthcare maintenance Mr. Crickenberger continues to do well, and ulcerative colitis is well controlled.  Blood pressure is at goal.  STI screening is not indicated as he is monogamous with his wife.  Hepatitis C screening was offered, which he declined.  He is at low risk for hepatitis C infection.  He does not need any other labs today.  He will continue to follow-up with GI regarding his ulcerative colitis.  He will follow-up with [place] in 1 year or earlier if needed.    Lennox Solders, MD [facility]

## 2020-02-16 ENCOUNTER — Encounter: Payer: Commercial Managed Care - PPO | Admitting: Family Medicine

## 2020-05-30 ENCOUNTER — Other Ambulatory Visit: Payer: Self-pay | Admitting: Gastroenterology

## 2020-07-02 ENCOUNTER — Other Ambulatory Visit: Payer: Self-pay | Admitting: Gastroenterology

## 2020-07-03 NOTE — Telephone Encounter (Signed)
This patient with UC was last seen over a year ago.  He needs a BMP and a clinic visit with me (can do them the same day).    When appt scheduled, the Apriso can be refilled for 2 months  - HD

## 2020-07-04 ENCOUNTER — Other Ambulatory Visit: Payer: Self-pay

## 2020-07-04 DIAGNOSIS — K5190 Ulcerative colitis, unspecified, without complications: Secondary | ICD-10-CM

## 2020-08-02 ENCOUNTER — Other Ambulatory Visit: Payer: Self-pay | Admitting: Gastroenterology

## 2020-08-22 ENCOUNTER — Other Ambulatory Visit (INDEPENDENT_AMBULATORY_CARE_PROVIDER_SITE_OTHER): Payer: Commercial Managed Care - PPO

## 2020-08-22 ENCOUNTER — Ambulatory Visit: Payer: Commercial Managed Care - PPO | Admitting: Gastroenterology

## 2020-08-22 ENCOUNTER — Encounter: Payer: Self-pay | Admitting: Gastroenterology

## 2020-08-22 VITALS — BP 110/68 | HR 68 | Ht 72.0 in | Wt 164.8 lb

## 2020-08-22 DIAGNOSIS — K5130 Ulcerative (chronic) rectosigmoiditis without complications: Secondary | ICD-10-CM

## 2020-08-22 DIAGNOSIS — K5190 Ulcerative colitis, unspecified, without complications: Secondary | ICD-10-CM | POA: Diagnosis not present

## 2020-08-22 LAB — CBC WITH DIFFERENTIAL/PLATELET
Basophils Absolute: 0.1 10*3/uL (ref 0.0–0.1)
Basophils Relative: 1.3 % (ref 0.0–3.0)
Eosinophils Absolute: 0.2 10*3/uL (ref 0.0–0.7)
Eosinophils Relative: 2.9 % (ref 0.0–5.0)
HCT: 42.3 % (ref 39.0–52.0)
Hemoglobin: 15.1 g/dL (ref 13.0–17.0)
Lymphocytes Relative: 27.3 % (ref 12.0–46.0)
Lymphs Abs: 1.5 10*3/uL (ref 0.7–4.0)
MCHC: 35.6 g/dL (ref 30.0–36.0)
MCV: 86.7 fl (ref 78.0–100.0)
Monocytes Absolute: 0.4 10*3/uL (ref 0.1–1.0)
Monocytes Relative: 7.2 % (ref 3.0–12.0)
Neutro Abs: 3.3 10*3/uL (ref 1.4–7.7)
Neutrophils Relative %: 61.3 % (ref 43.0–77.0)
Platelets: 233 10*3/uL (ref 150.0–400.0)
RBC: 4.88 Mil/uL (ref 4.22–5.81)
RDW: 12.2 % (ref 11.5–15.5)
WBC: 5.3 10*3/uL (ref 4.0–10.5)

## 2020-08-22 LAB — BASIC METABOLIC PANEL
BUN: 18 mg/dL (ref 6–23)
CO2: 34 mEq/L — ABNORMAL HIGH (ref 19–32)
Calcium: 10.1 mg/dL (ref 8.4–10.5)
Chloride: 102 mEq/L (ref 96–112)
Creatinine, Ser: 1.02 mg/dL (ref 0.40–1.50)
GFR: 100.32 mL/min (ref >60.00)
Glucose, Bld: 79 mg/dL (ref 70–99)
Potassium: 4.1 mEq/L (ref 3.5–5.1)
Sodium: 139 mEq/L (ref 135–145)

## 2020-08-22 LAB — COMPREHENSIVE METABOLIC PANEL
ALT: 10 U/L (ref 0–53)
AST: 3 U/L (ref 0–37)
Albumin: 4.6 g/dL (ref 3.5–5.2)
Alkaline Phosphatase: 68 U/L (ref 39–117)
BUN: 18 mg/dL (ref 6–23)
CO2: 34 mEq/L — ABNORMAL HIGH (ref 19–32)
Calcium: 10.1 mg/dL (ref 8.4–10.5)
Chloride: 102 mEq/L (ref 96–112)
Creatinine, Ser: 1.02 mg/dL (ref 0.40–1.50)
GFR: 100.32 mL/min (ref >60.00)
Glucose, Bld: 79 mg/dL (ref 70–99)
Potassium: 4.1 mEq/L (ref 3.5–5.1)
Sodium: 139 mEq/L (ref 135–145)
Total Bilirubin: 1.7 mg/dL — ABNORMAL HIGH (ref 0.2–1.2)
Total Protein: 7.5 g/dL (ref 6.0–8.3)

## 2020-08-22 NOTE — Progress Notes (Signed)
[facility] Progress Note  Chief Complaint: Ulcerative colitis  Subjective  History: Initially seen for office consult September 2020, having been diagnosed with ulcerative proctosigmoiditis at an outside practice about 18 months prior.  Patient was in remission on oral mesalamine, confirmed on colonoscopy with [place] in September 2020.  Biopsies normal as below. He contacted [place] in January of this year with a flare of symptoms including some urgency, mucus and blood.  A short course of mesalamine suppository resolved the symptoms.  This is his first clinic follow-up since then.  Nida Boatman is glad to report that he has been feeling well after quickly recovering from that flare earlier this year.  He denies chronic abdominal pain, current rectal bleeding, nausea or vomiting.  He usually has 2 or 3 BMs daily before midday, becoming softer but still formed.  He is taking the Apriso 2 capsules twice daily, and I told him he could take all 4 at once if he likes.  He has not needed the suppository since the flare in February. His health has been good overall and he feels well other than lack of sleep with a 53-month-old child at home.  ROS: Cardiovascular:  no chest pain Respiratory: no dyspnea  The patient's Past Medical, Family and Social History were reviewed and are on file in the EMR.  Objective:  Med list reviewed  Current Outpatient Medications:  .  APRISO 0.375 g 24 hr capsule, TAKE TWO CAPSULES BY MOUTH TWICE A DAY, Disp: 120 capsule, Rfl: 0   Vital signs in last 24 hrs: Vitals:   08/22/20 0931  BP: 110/68  Pulse: 68  SpO2: 98%    Physical Exam  Well-appearing  HEENT: sclera anicteric, oral mucosa moist without lesions  Neck: supple, no thyromegaly, JVD or lymphadenopathy  Cardiac: RRR without murmurs, S1S2 heard, no peripheral edema  Pulm: clear to auscultation bilaterally, normal RR and effort noted  Abdomen: soft, no tenderness, with active bowel sounds. No  guarding or palpable hepatosplenomegaly.  Skin; warm and dry, no jaundice or rash  Labs:  BMP Latest Ref Rng & Units 05/06/2019 02/16/2018 02/16/2018  Glucose 70 - 99 mg/dL 27(X) 89 92  BUN 6 - 23 mg/dL 11 15 13   Creatinine 0.40 - 1.50 mg/dL 4.12 8.78  Sodium 135 - 145 mEq/L 141 140 141  Potassium 3.5 - 5.1 mEq/L 4.2 4.4 4.4  Chloride 96 - 112 mEq/L 102 100(L) 103  CO2 19 - 32 mEq/L 33(H) - 30  Calcium 8.4 - 10.5 mg/dL 9.9 - 9.4    ___________________________________________ Radiologic studies:   ____________________________________________ Other: 1. Surgical [P], right colon - BENIGN COLONIC MUCOSA - NO ACTIVE INFLAMMATION, DYSPLASIA OR MALIGNANCY IDENTIFIED 2. Surgical [P], left colon - BENIGN COLONIC MUCOSA - NO ACTIVE INFLAMMATION, DYSPLASIA OR MALIGNANCY IDENTIFIED 3. Surgical [P], colon, rectum - BENIGN COLONIC MUCOSA - NO ACTIVE INFLAMMATION, DYSPLASIA OR MALIGNANCY IDENTIFIED  _____________________________________________ Assessment & Plan  Assessment: Encounter Diagnosis  Name Primary?  . Ulcerative rectosigmoiditis without complication (HCC) Yes    Good control of his proctosigmoiditis, briefly earlier this year responded well to mesalamine suppository. His health is good overall, he got Covid vaccines and he takes a daily calcium supplement because he avoids dairy due to lactose intolerance.  Plan: Continue current dose of Apriso BMP to check kidney function given the rare risk of nephritis from mesalamine Hepatic function panel to screen for PSC. CBC to ensure no anemia  See me in 6 months or sooner as  needed.  20 minutes were spent on this encounter (including chart review, history/exam, counseling/coordination of care, and documentation)  Charlie Pitter III

## 2020-08-22 NOTE — Patient Instructions (Signed)
If you are age 28 or older, your body mass index should be between 23-30. Your Body mass index is 22.35 kg/m. If this is out of the aforementioned range listed, please consider follow up with your Primary Care Provider.  If you are age 60 or younger, your body mass index should be between 19-25. Your Body mass index is 22.35 kg/m. If this is out of the aformentioned range listed, please consider follow up with your Primary Care Provider.   Your provider has requested that you go to the basement level for lab work before leaving today. Press "B" on the elevator. The lab is located at the first door on the left as you exit the elevator.  Due to recent changes in healthcare laws, you may see the results of your imaging and laboratory studies on MyChart before your provider has had a chance to review them.  We understand that in some cases there may be results that are confusing or concerning to you. Not all laboratory results come back in the same time frame and the provider may be waiting for multiple results in order to interpret others.  Please give [place] 48 hours in order for your provider to thoroughly review all the results before contacting the office for clarification of your results.   Thank you for entrusting me with your care and choosing [facility]  Dr Myrtie Neither

## 2020-08-23 ENCOUNTER — Other Ambulatory Visit: Payer: Self-pay

## 2020-08-23 DIAGNOSIS — K5130 Ulcerative (chronic) rectosigmoiditis without complications: Secondary | ICD-10-CM

## 2020-09-01 ENCOUNTER — Other Ambulatory Visit: Payer: Self-pay | Admitting: Gastroenterology

## 2020-11-21 ENCOUNTER — Telehealth: Payer: Self-pay

## 2020-11-21 NOTE — Telephone Encounter (Signed)
Spoke with patient to remind him that he is due for repeat labs at this time. Patient is aware tat no appt is necessary. Patient is aware that he can stop by the lab in the basement at his convenience between 7:30 AM - 5 PM, Monday through Friday. Patient verbalized understanding and had no concerns at the end of the call.

## 2020-11-21 NOTE — Telephone Encounter (Signed)
-----   Message from Missy Sabins, RN sent at 08/23/2020  9:59 AM EST ----- Regarding: Labs Repeat hepatic function panel and fractionated bilirubin. Order in epic

## 2021-02-01 ENCOUNTER — Other Ambulatory Visit: Payer: Self-pay | Admitting: Gastroenterology

## 2021-02-07 ENCOUNTER — Telehealth: Payer: Self-pay | Admitting: Gastroenterology

## 2021-02-07 NOTE — Telephone Encounter (Signed)
Inbound call from patient brand name Matthew Camacho is too expensive and is wanting to know if script can be changed so he can pick up generic.  Please advise.

## 2021-02-08 MED ORDER — MESALAMINE ER 0.375 G PO CP24: 0.7500 g | ORAL_CAPSULE | Freq: Two times a day (BID) | ORAL | 1 refills | Status: DC

## 2021-02-08 NOTE — Telephone Encounter (Addendum)
Apriso 0.375gm is name brand approved only with his insurance with a $100 dollar copay for a 30 days supply.  I have spent over an hour and a half on the phone with his insurance to get the copay amount lowered or get the generic approved. Insurance will only cover name brand at a 30 days supply for $100 copay. We tried to send in a 90 days supply and insurance rejected it.   Patient talked to the pharmacy and sent me an update via my chart. He is requesting a medication change   From [facility] mychart - mesalamine er 500mg  x 3 pills (same as apriso er 0.375g x  pills I am currently taking). Believe it or not, the copay came out to $10   Can we do a medication change as requested by the patient?

## 2021-02-08 NOTE — Telephone Encounter (Signed)
Patient calling to follow up on previous message said he will run out soon.

## 2021-02-09 ENCOUNTER — Other Ambulatory Visit: Payer: Self-pay

## 2021-02-09 MED ORDER — MESALAMINE ER 500 MG PO CPCR: 500.0000 mg | ORAL_CAPSULE | Freq: Three times a day (TID) | ORAL | 3 refills | Status: DC

## 2021-02-09 NOTE — Telephone Encounter (Signed)
I have sent in the new Rx and informed the patient via MYCHART.

## 2021-02-09 NOTE — Telephone Encounter (Signed)
Thank you for the note and the enormous amount of time you spent working on this because understanding insurance formulary is a tremendous frustration.   To clarify, different forms of mesalamine are not necessarily interchangeable, even if they have similar milligram doses.  This has to do with their chemical structure, bioavailability and whether they are short acting or time-released medicines.  Therefore, changing from one form to another will also not necessarily be as effective.  I do understand the cost issue and I am willing to make a change if Matthew Camacho would like to try.  The mesalamine ER 500 mg is the generic of a medicine called Pentasa, which is uncommonly used any more for inflammatory bowel disease, primarily because needs to be taken 3-4 times daily.  Truthfully, I was not aware it was even still available on the market.  If Matthew Camacho wants to make a change to mesalamine ER 500 mg tablets, then his dose would be 2 tablets (1000 mg) 3 times daily.  You can send a prescription for that with 3 refills, and I would like him to contact me by portal or phone call to nursing in 6 to 8 weeks with a follow-up on his symptoms (or sooner if necessary).  - HD

## 2021-02-13 ENCOUNTER — Encounter: Payer: Self-pay | Admitting: Family Medicine

## 2021-02-13 ENCOUNTER — Other Ambulatory Visit: Payer: Self-pay

## 2021-02-13 ENCOUNTER — Other Ambulatory Visit: Payer: Self-pay | Admitting: Family Medicine

## 2021-02-13 ENCOUNTER — Ambulatory Visit (INDEPENDENT_AMBULATORY_CARE_PROVIDER_SITE_OTHER): Payer: Commercial Managed Care - PPO | Admitting: Family Medicine

## 2021-02-13 VITALS — BP 110/74 | HR 58 | Ht 72.0 in | Wt 165.0 lb

## 2021-02-13 DIAGNOSIS — L80 Vitiligo: Secondary | ICD-10-CM | POA: Insufficient documentation

## 2021-02-13 DIAGNOSIS — Z0000 Encounter for general adult medical examination without abnormal findings: Secondary | ICD-10-CM | POA: Diagnosis not present

## 2021-02-13 DIAGNOSIS — Z1159 Encounter for screening for other viral diseases: Secondary | ICD-10-CM

## 2021-02-13 NOTE — Patient Instructions (Signed)
Thank you for coming to see me today. It was a pleasure. Your physical was normal. You are doing great!  We will get some labs today.  If they are abnormal or we need to do something about them, I will call you.  If they are normal, I will send you a message on MyChart (if it is active) or a letter in the mail.  If you don't hear from [place] in 2 weeks, please call the office at the number below.   Please follow-up with me as and when you need.  If you have any questions or concerns, please do not hesitate to call the office at [phone].  Best wishes,   Dr Allena Katz

## 2021-02-13 NOTE — Progress Notes (Signed)
    SUBJECTIVE:   Chief compliant/HPI: annual examination  Matthew Camacho is a 30 y.o. who presents today for an annual exam.   Pt denies concerns today.   Reviewed and updated history.   OBJECTIVE:   BP 110/74   Pulse (!) 58   Ht 6' (1.829 m)   Wt 165 lb (74.8 kg)   SpO2 98%   BMI 22.38 kg/m    General: Alert, no acute distress Cardio: Normal S1 and S2, RRR, no r/m/g Pulm: CTAB, normal work of breathing Abdomen: Bowel sounds normal. Abdomen soft and non-tender.  Extremities: No peripheral edema.  Neuro: Cranial nerves grossly intact   ASSESSMENT/PLAN:   Encounter for annual physical exam Matthew Camacho is doing well today. Denies concerns. Chronic problems: vitiligo and colitis are chronic and stable. Pt was interested in Advanced directive planning. He was given the Advanced directive booklet. He will bring it back to [facility] to get it notarized. Obtained Hep B surface antigen and Hep c Ab screening today. Follow up in 1 year or sooner if needed.    Annual Examination  See AVS for age appropriate recommendations  PHQ score 0, reviewed and discussed.  Blood pressure reviewed and at goal.    Advanced directive: Pt is interested in this. He will take the leaflet home and discuss with his wife.   Considered the following items based upon USPSTF recommendations: HIV testing: ordered Hepatitis C: requested Hepatitis B: requested Syphilis if at high risk: declined GC/CTdeclined Lipid panel (nonfasting or fasting) discussed based upon AHA recommendations and not ordered.  Consider repeat every 4-6 years.  Reviewed risk factors for latent tuberculosis and not indicated Immunizations up to date   Follow up in 1 year or sooner if indicated.    Towanda Octave, MD [facility]

## 2021-02-14 DIAGNOSIS — Z0000 Encounter for general adult medical examination without abnormal findings: Secondary | ICD-10-CM | POA: Insufficient documentation

## 2021-02-14 LAB — HEPATITIS B SURFACE ANTIGEN: Hepatitis B Surface Ag: NEGATIVE

## 2021-02-14 LAB — HEPATITIS C ANTIBODY: Hep C Virus Ab: <0.1 s/co ratio (ref 0.0–0.9)

## 2021-02-14 NOTE — Assessment & Plan Note (Signed)
Matthew Camacho is doing well today. Denies concerns. Chronic problems: vitiligo and colitis are chronic and stable. Pt was interested in Advanced directive planning. He was given the Advanced directive booklet. He will bring it back to [facility] to get it notarized. Obtained Hep B surface antigen and Hep c Ab screening today. Follow up in 1 year or sooner if needed.

## 2021-04-16 MED ORDER — MESALAMINE 1000 MG RE SUPP: 1000.0000 mg | Freq: Every day | RECTAL | 1 refills | Status: DC

## 2021-04-16 NOTE — Telephone Encounter (Signed)
Dr. Adela Lank, as DOD PM of 04/16/21. Please advise, thanks.  Dr. 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 Thanks

## 2021-05-29 ENCOUNTER — Other Ambulatory Visit (INDEPENDENT_AMBULATORY_CARE_PROVIDER_SITE_OTHER): Payer: Commercial Managed Care - PPO

## 2021-05-29 ENCOUNTER — Ambulatory Visit: Payer: Commercial Managed Care - PPO | Admitting: Gastroenterology

## 2021-05-29 ENCOUNTER — Encounter: Payer: Self-pay | Admitting: Gastroenterology

## 2021-05-29 VITALS — BP 100/60 | HR 80 | Ht 72.0 in | Wt 164.0 lb

## 2021-05-29 DIAGNOSIS — K5130 Ulcerative (chronic) rectosigmoiditis without complications: Secondary | ICD-10-CM

## 2021-05-29 DIAGNOSIS — K625 Hemorrhage of anus and rectum: Secondary | ICD-10-CM

## 2021-05-29 LAB — COMPREHENSIVE METABOLIC PANEL
ALT: 14 U/L (ref 0–53)
AST: 15 U/L (ref 0–37)
Albumin: 4.6 g/dL (ref 3.5–5.2)
Alkaline Phosphatase: 74 U/L (ref 39–117)
BUN: 16 mg/dL (ref 6–23)
CO2: 30 mEq/L (ref 19–32)
Calcium: 9.6 mg/dL (ref 8.4–10.5)
Chloride: 102 mEq/L (ref 96–112)
Creatinine, Ser: 0.98 mg/dL (ref 0.40–1.50)
GFR: 104.69 mL/min (ref >60.00)
Glucose, Bld: 92 mg/dL (ref 70–99)
Potassium: 4 mEq/L (ref 3.5–5.1)
Sodium: 139 mEq/L (ref 135–145)
Total Bilirubin: 0.6 mg/dL (ref 0.2–1.2)
Total Protein: 7.6 g/dL (ref 6.0–8.3)

## 2021-05-29 LAB — CBC WITH DIFFERENTIAL/PLATELET
Basophils Absolute: 0.1 10*3/uL (ref 0.0–0.1)
Basophils Relative: 1.4 % (ref 0.0–3.0)
Eosinophils Absolute: 0.1 10*3/uL (ref 0.0–0.7)
Eosinophils Relative: 1.7 % (ref 0.0–5.0)
HCT: 42.5 % (ref 39.0–52.0)
Hemoglobin: 14.7 g/dL (ref 13.0–17.0)
Lymphocytes Relative: 24.9 % (ref 12.0–46.0)
Lymphs Abs: 1.9 10*3/uL (ref 0.7–4.0)
MCHC: 34.6 g/dL (ref 30.0–36.0)
MCV: 87 fl (ref 78.0–100.0)
Monocytes Absolute: 0.6 10*3/uL (ref 0.1–1.0)
Monocytes Relative: 7.9 % (ref 3.0–12.0)
Neutro Abs: 4.8 10*3/uL (ref 1.4–7.7)
Neutrophils Relative %: 64.1 % (ref 43.0–77.0)
Platelets: 240 10*3/uL (ref 150.0–400.0)
RBC: 4.88 Mil/uL (ref 4.22–5.81)
RDW: 12.3 % (ref 11.5–15.5)
WBC: 7.5 10*3/uL (ref 4.0–10.5)

## 2021-05-29 LAB — SEDIMENTATION RATE: Sed Rate: 7 mm/hr (ref 0–15)

## 2021-05-29 NOTE — Patient Instructions (Addendum)
If you are age 29 or older, your body mass index should be between 23-30. Your Body mass index is 22.24 kg/m. If this is out of the aforementioned range listed, please consider follow up with your Primary Care Provider.  If you are age 21 or younger, your body mass index should be between 19-25. Your Body mass index is 22.24 kg/m. If this is out of the aformentioned range listed, please consider follow up with your Primary Care Provider.   __________________________________________________________  The [place] [facility] providers would like to encourage you to use [facility] to communicate with providers for non-urgent requests or questions.  Due to long hold times on the telephone, sending your provider a message by [facility] may be a faster and more efficient way to get a response.  Please allow 48 business hours for a response.  Please remember that this is for non-urgent requests.   Please go to the lab in the basement of our building to have lab work done as you leave today. Hit "B" for basement when you get on the elevator.  When the doors open the lab is on your left.  We will call you with the results. Thank you.    Please follow up in 6 months.  It was a pleasure to see you today!  Thank you for trusting me with your gastrointestinal care!

## 2021-05-29 NOTE — Progress Notes (Signed)
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  At last visit December 2021 he was doing well, having had a flare of symptoms with blood and mucus in early 2021 that responded well to mesalamine suppository. Last colonoscopy September 2020  Matthew Camacho has been well overall since I last saw him.  He takes 4 Apriso capsules every morning with no difficulty or apparent side effect.  Every 2 to 3 months he will have a small amount of mucus or blood in the stool, then will use a mesalamine suppository for 5 to 7 days and symptoms resolved.  He denies abdominal pain, nausea, vomiting, joint swelling or eye redness/pain.  His daughters about a year and a half now, in good health and he is very much enjoying being a father.  ROS: Cardiovascular:  no chest pain Respiratory: no dyspnea  The patient's Past Medical, Family and Social History were reviewed and are on file in the EMR.  Objective:  Med list reviewed  Current Outpatient Medications:    mesalamine (APRISO) 0.375 g 24 hr capsule, Take 2 capsules by mouth 2 (two) times daily., Disp: , Rfl:    mesalamine (CANASA) 1000 MG suppository, Place 1 suppository (1,000 mg total) rectally at bedtime., Disp: 30 suppository, Rfl: 1  He is also taking a calcium and vitamin D supplement daily  Vital signs in last 24 hrs: Vitals:   05/29/21 1503  BP: 100/60  Pulse: 80   Wt Readings from Last 3 Encounters:  05/29/21 164 lb (74.4 kg)  02/13/21 165 lb (74.8 kg)  08/22/20 164 lb 12.8 oz (74.8 kg)    Physical Exam  Well-appearing HEENT: sclera anicteric, oral mucosa moist without lesions Neck: supple, no thyromegaly, JVD or lymphadenopathy Cardiac: RRR without murmurs, S1S2 heard, no peripheral edema Pulm: clear to auscultation bilaterally, normal RR and effort noted Abdomen: soft, no tenderness,  with active bowel sounds. No guarding or palpable hepatosplenomegaly. Skin; warm and dry, no jaundice or rash  Labs:  CBC Latest Ref Rng & Units 08/22/2020 05/06/2019 02/16/2018  WBC 4.0 - 10.5 K/uL 5.3 6.2 -  Hemoglobin 13.0 - 17.0 g/dL 15.1 15.4 13.9  Hematocrit 39.0 - 52.0 % 42.3 43.6 41.0  Platelets 150.0 - 400.0 K/uL 233.0 234.0 -   CMP Latest Ref Rng & Units 08/22/2020 08/22/2020 05/06/2019  Glucose 70 - 99 mg/dL 79 79 59(L)  BUN 6 - 23 mg/dL $Remove'18 18 11  'jkpdesw$ Creatinine 0.40 - 1.50 mg/dL 1.02 1.02 0.91  Sodium 135 - 145 mEq/L 139 139 141  Potassium 3.5 - 5.1 mEq/L 4.1 4.1 4.2  Chloride 96 - 112 mEq/L 102 102 102  CO2 19 - 32 mEq/L 34(H) 34(H) 33(H)  Calcium 8.4 - 10.5 mg/dL 10.1 10.1 9.9  Total Protein 6.0 - 8.3 g/dL - 7.5 7.5  Total Bilirubin 0.2 - 1.2 mg/dL - 1.7(H) 1.3(H)  Alkaline Phos 39 - 117 U/L - 68 70  AST 0 - 37 U/L - 3 19  ALT 0 - 53 U/L - 10 21    ___________________________________________ Radiologic studies:   ____________________________________________ Other:   _____________________________________________ Assessment & Plan  Assessment: Encounter Diagnoses  Name Primary?   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  We will  continue current treatment plan and I will see him in 6 months or sooner if necessary.  I strongly encouraged him to call me if he he has having more frequent episodes of breakthrough symptoms, as it may mean colitis has become more severe, more extensive or both, then prompting need for colonoscopy  Labs today: CBC, CMP, ESR   Matthew Camacho

## 2021-08-16 ENCOUNTER — Encounter: Payer: Self-pay | Admitting: Gastroenterology

## 2021-09-24 ENCOUNTER — Encounter: Payer: Self-pay | Admitting: Gastroenterology

## 2021-09-24 NOTE — Telephone Encounter (Signed)
Resume mesalamine suppository every night (send new prescription if needed)  Also ask if he can come to an appointment 820 this Wednesday morning (810 arrival) so we can discuss the ongoing management of his colitis.  HD

## 2021-09-26 ENCOUNTER — Other Ambulatory Visit: Payer: Self-pay

## 2021-09-26 ENCOUNTER — Telehealth: Payer: Self-pay | Admitting: Gastroenterology

## 2021-09-26 ENCOUNTER — Ambulatory Visit: Payer: Commercial Managed Care - PPO | Admitting: Gastroenterology

## 2021-09-26 ENCOUNTER — Encounter: Payer: Self-pay | Admitting: Gastroenterology

## 2021-09-26 VITALS — BP 110/60 | HR 68 | Ht 72.0 in | Wt 162.6 lb

## 2021-09-26 DIAGNOSIS — K5130 Ulcerative (chronic) rectosigmoiditis without complications: Secondary | ICD-10-CM | POA: Diagnosis not present

## 2021-09-26 DIAGNOSIS — K625 Hemorrhage of anus and rectum: Secondary | ICD-10-CM | POA: Diagnosis not present

## 2021-09-26 MED ORDER — MESALAMINE 1000 MG RE SUPP: 1000.0000 mg | Freq: Every day | RECTAL | 3 refills | Status: DC

## 2021-09-26 MED ORDER — MESALAMINE ER 0.375 G PO CP24: 0.7500 g | ORAL_CAPSULE | Freq: Two times a day (BID) | ORAL | 3 refills | Status: DC

## 2021-09-26 NOTE — Patient Instructions (Signed)
If you are age 30 or older, your body mass index should be between 23-30. Your Body mass index is 22.05 kg/m. If this is out of the aforementioned range listed, please consider follow up with your Primary Care Provider.  If you are age 86 or younger, your body mass index should be between 19-25. Your Body mass index is 22.05 kg/m. If this is out of the aformentioned range listed, please consider follow up with your Primary Care Provider.   ________________________________________________________  The [place] [facility] providers would like to encourage you to use [facility] to communicate with providers for non-urgent requests or questions.  Due to long hold times on the telephone, sending your provider a message by [facility] may be a faster and more efficient way to get a response.  Please allow 48 business hours for a response.  Please remember that this is for non-urgent requests.  _______________________________________________________  It was a pleasure to see you today!  Thank you for trusting me with your gastrointestinal care!

## 2021-09-26 NOTE — Telephone Encounter (Signed)
Patient called requested we send the script to [facility] instead due to insurance.

## 2021-09-26 NOTE — Telephone Encounter (Signed)
Rx has been [facility] as [facility] for a 90 days supply for each the Apriso and the Canasa.

## 2021-09-26 NOTE — Progress Notes (Signed)
[facility] Progress Note  Chief Complaint: Ulcerative proctosigmoiditis  Subjective  History: Last office visit September 2022. Matthew Camacho contacted our office in December and again earlier this week with flares of symptoms with small-volume bleeding and rectal mucus after having had a COVID infection.  When this has occurred in the past, he has gone back on short course of mesalamine suppository in addition to his daily Apriso with good results. Last colonoscopy September 2020 with complete macroscopic and microscopic remission on oral mesalamine.  [facility] says he feels quite well overall.  He denies abdominal pain, loose stool or tenesmus.  He has a small amount of blood or mucus on the stool every 2 or 3 days, denies frank rectal bleeding.  His COVID infection was mild with a couple days of sore throat.  ROS: Cardiovascular:  no chest pain Respiratory: no dyspnea Denies arthralgias, rash, painful eye redness The patient's Past Medical, Family and Social History were reviewed and are on file in the EMR.  Objective:  Med list reviewed  Current Outpatient Medications:    mesalamine (APRISO) 0.375 g 24 hr capsule, Take 2 capsules by mouth 2 (two) times daily., Disp: , Rfl:    mesalamine (CANASA) 1000 MG suppository, Place 1 suppository (1,000 mg total) rectally at bedtime., Disp: 30 suppository, Rfl: 3   Vital signs in last 24 hrs: Vitals:   09/26/21 0830  BP: 110/60  Pulse: 68   Wt Readings from Last 3 Encounters:  09/26/21 162 lb 9.6 oz (73.8 kg)  05/29/21 164 lb (74.4 kg)  02/13/21 165 lb (74.8 kg)    Physical Exam  Well-appearing HEENT: sclera anicteric, oral mucosa moist without lesions Cardiac: RRR without murmurs, S1S2 heard, no peripheral edema Pulm: clear to auscultation bilaterally, normal RR and effort noted Abdomen: soft, no tenderness, with active bowel sounds. No guarding or palpable hepatosplenomegaly.  Labs:  CBC Latest Ref Rng & Units  05/29/2021 08/22/2020 05/06/2019  WBC 4.0 - 10.5 K/uL 7.5 5.3 6.2  Hemoglobin 13.0 - 17.0 g/dL 14.7 15.1 15.4  Hematocrit 39.0 - 52.0 % 42.5 42.3 43.6  Platelets 150.0 - 400.0 K/uL 240.0 233.0 234.0   CMP Latest Ref Rng & Units 05/29/2021 08/22/2020 08/22/2020  Glucose 70 - 99 mg/dL 92 79 79  BUN 6 - 23 mg/dL 16 18 18   Creatinine 0.40 - 1.50 mg/dL 0.98 1.02 1.02  Sodium 135 - 145 mEq/L 139 139 139  Potassium 3.5 - 5.1 mEq/L 4.0 4.1 4.1  Chloride 96 - 112 mEq/L 102 102 102  CO2 19 - 32 mEq/L 30 34(H) 34(H)  Calcium 8.4 - 10.5 mg/dL 9.6 10.1 10.1  Total Protein 6.0 - 8.3 g/dL 7.6 - 7.5  Total Bilirubin 0.2 - 1.2 mg/dL 0.6 - 1.7(H)  Alkaline Phos 39 - 117 U/L 74 - 68  AST 0 - 37 U/L 15 - 3  ALT 0 - 53 U/L 14 - 10    ___________________________________________ Radiologic studies:   ____________________________________________ Other:   _____________________________________________ Assessment & Plan  Assessment: Encounter Diagnoses  Name Primary?   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  Otherwise well without additional symptoms.  Based on that, it seems unlikely that his condition has become more extensive in the left colon or pancolitis.  He looks and feels well overall.  Plan:  Mesalamine 1000 mg suppository nightly for the next 2 months, continue Lialda at current dose of  4 capsules once daily.  He will send me a portal message every few weeks during that time and call sooner if needed should it be any significant worsening of symptoms or other concerns.  If he responds well to that with resolution of the blood and mucus, we will probably stop the mesalamine suppository and watch for recurrence of symptoms.  If that should occur relatively soon afterward, I will most likely have him continue the suppository long-term.  If he does not respond to this  treatment as expected, and certainly if has significant worsening, then colonoscopy to reassess disease activity and extent may be warranted.  He was comfortable with that plan.  23 minutes were spent on this encounter (including chart review, history/exam, counseling/coordination of care, and documentation) > 50% of that time was spent on counseling and coordination of care.   Matthew Camacho

## 2021-12-17 ENCOUNTER — Encounter: Payer: Self-pay | Admitting: Gastroenterology

## 2021-12-17 ENCOUNTER — Telehealth: Payer: Self-pay

## 2021-12-17 NOTE — Telephone Encounter (Signed)
90 Omeprazole sent ?

## 2021-12-17 NOTE — Telephone Encounter (Signed)
Spoke with [facility] who confirmed the patient's Apriso was set up such that it will only be $25.  He just needs to call them when he is ready for a refill it.  Sent patient a Clinical cytogeneticist message with this information ?

## 2021-12-20 NOTE — Telephone Encounter (Signed)
Yes, that wold be fine. ?Please send it. ?- HD ?

## 2021-12-21 ENCOUNTER — Other Ambulatory Visit: Payer: Self-pay

## 2021-12-21 MED ORDER — MESALAMINE ER 0.375 G PO CP24: 0.7500 g | ORAL_CAPSULE | Freq: Two times a day (BID) | ORAL | 3 refills | Status: DC

## 2022-02-05 ENCOUNTER — Emergency Department (HOSPITAL_COMMUNITY): Payer: Commercial Managed Care - PPO

## 2022-02-05 ENCOUNTER — Emergency Department (HOSPITAL_COMMUNITY)
Admission: EM | Admit: 2022-02-05 | Discharge: 2022-02-05 | Disposition: A | Discharge status: Home / Self Care | Payer: Commercial Managed Care - PPO | Attending: Student | Admitting: Student

## 2022-02-05 ENCOUNTER — Encounter: Payer: Self-pay | Admitting: *Deleted

## 2022-02-05 ENCOUNTER — Other Ambulatory Visit: Payer: Self-pay

## 2022-02-05 DIAGNOSIS — R202 Paresthesia of skin: Secondary | ICD-10-CM | POA: Insufficient documentation

## 2022-02-05 LAB — COMPREHENSIVE METABOLIC PANEL
ALT: 20 U/L (ref 0–44)
AST: 21 U/L (ref 15–41)
Albumin: 4.8 g/dL (ref 3.5–5.0)
Alkaline Phosphatase: 64 U/L (ref 38–126)
Anion gap: 11 (ref 5–15)
BUN: 17 mg/dL (ref 6–20)
CO2: 25 mmol/L (ref 22–32)
Calcium: 9.7 mg/dL (ref 8.9–10.3)
Chloride: 104 mmol/L (ref 98–111)
Creatinine, Ser: 0.98 mg/dL (ref 0.61–1.24)
GFR, Estimated: >60 mL/min (ref >60)
Glucose, Bld: 126 mg/dL — ABNORMAL HIGH (ref 70–99)
Potassium: 4 mmol/L (ref 3.5–5.1)
Sodium: 140 mmol/L (ref 135–145)
Total Bilirubin: 1.7 mg/dL — ABNORMAL HIGH (ref 0.3–1.2)
Total Protein: 7.7 g/dL (ref 6.5–8.1)

## 2022-02-05 LAB — CBC WITH DIFFERENTIAL/PLATELET
Abs Immature Granulocytes: 0.02 10*3/uL (ref 0.00–0.07)
Basophils Absolute: 0 10*3/uL (ref 0.0–0.1)
Basophils Relative: 1 %
Eosinophils Absolute: 0 10*3/uL (ref 0.0–0.5)
Eosinophils Relative: 0 %
HCT: 45.4 % (ref 39.0–52.0)
Hemoglobin: 15.4 g/dL (ref 13.0–17.0)
Immature Granulocytes: 0 %
Lymphocytes Relative: 12 %
Lymphs Abs: 1 10*3/uL (ref 0.7–4.0)
MCH: 30 pg (ref 26.0–34.0)
MCHC: 33.9 g/dL (ref 30.0–36.0)
MCV: 88.5 fL (ref 80.0–100.0)
Monocytes Absolute: 0.3 10*3/uL (ref 0.1–1.0)
Monocytes Relative: 4 %
Neutro Abs: 7.4 10*3/uL (ref 1.7–7.7)
Neutrophils Relative %: 83 %
Platelets: 251 10*3/uL (ref 150–400)
RBC: 5.13 MIL/uL (ref 4.22–5.81)
RDW: 11.8 % (ref 11.5–15.5)
WBC: 8.8 10*3/uL (ref 4.0–10.5)
nRBC: 0 % (ref 0.0–0.2)

## 2022-02-05 IMAGING — CT CT HEAD W/O CM
4 series · 16 of 47 positions shown, 18 images · non-contrast
Comparison: None Available.

CLINICAL DATA: Headache.  Left-sided numbness



[Series 3: head without · axial · non-contrast · 0.47mm/px · z∈[-121,-1]mm · 7 of 33 slices shown, 9 images]
[im 5/33  brain]
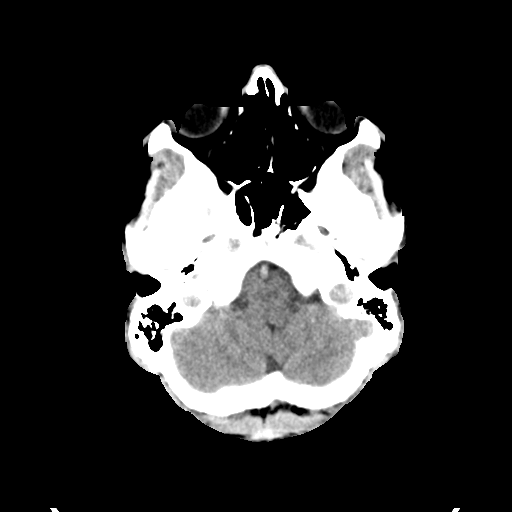
[im 5/33  bone]
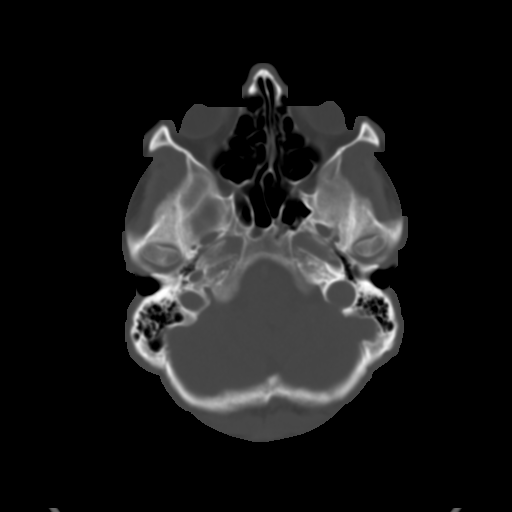
[im 9/33  brain]
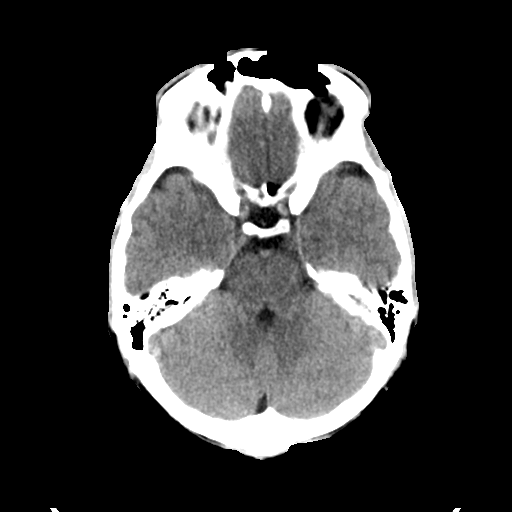
[im 13/33  brain]
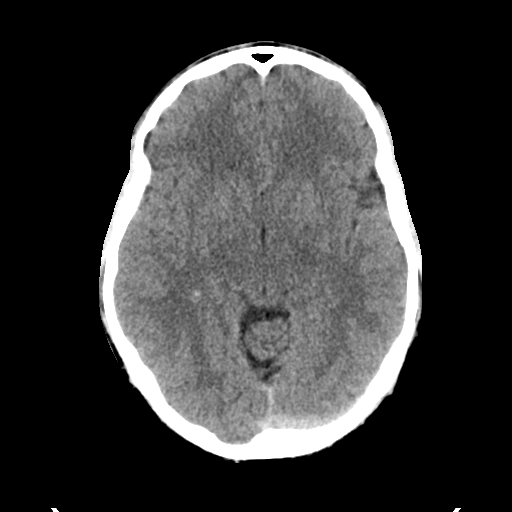
[im 17/33  brain]
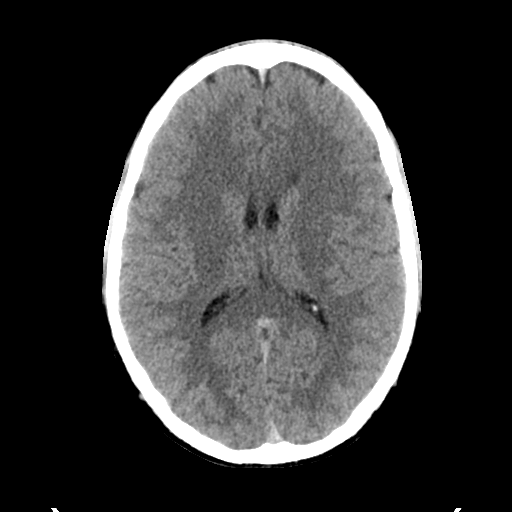
[im 21/33  brain]
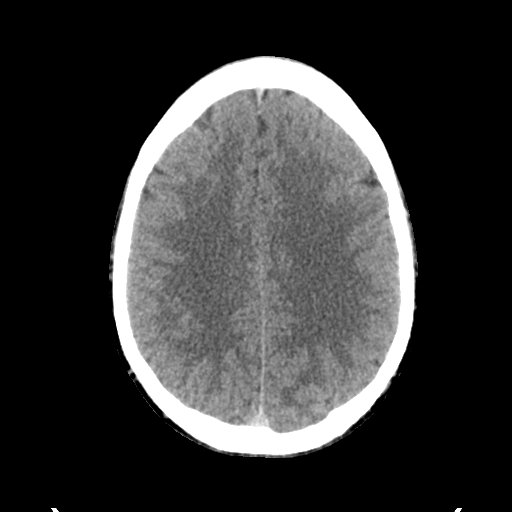
[im 21/33  bone]
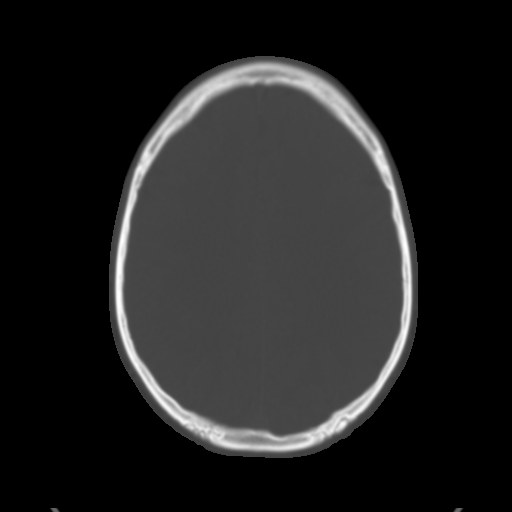
[im 25/33  brain]
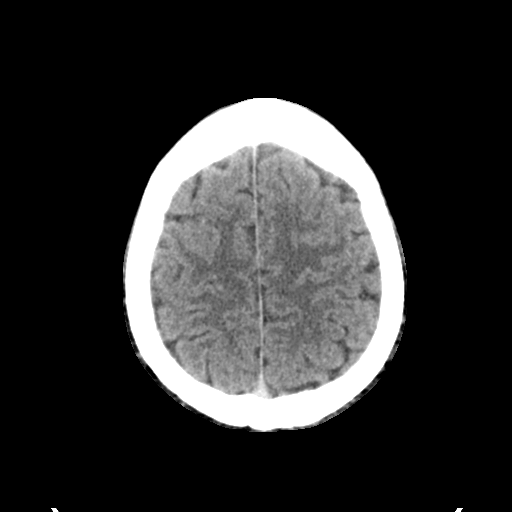
[im 29/33  brain]
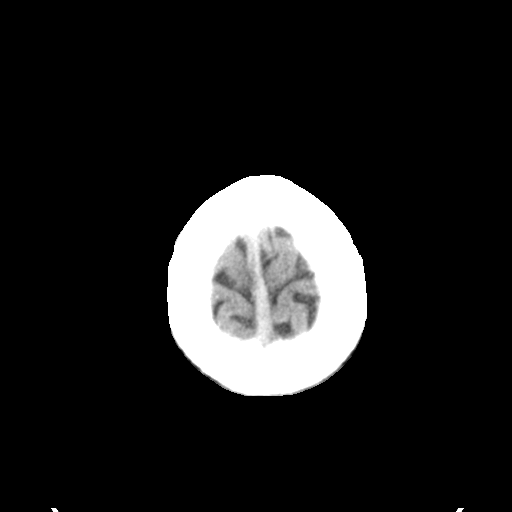

[Series 4: head bone · axial · 0.47mm/px · z∈[-125,-93]mm · 3 of 82 slices shown]
[im 9/82  bone]
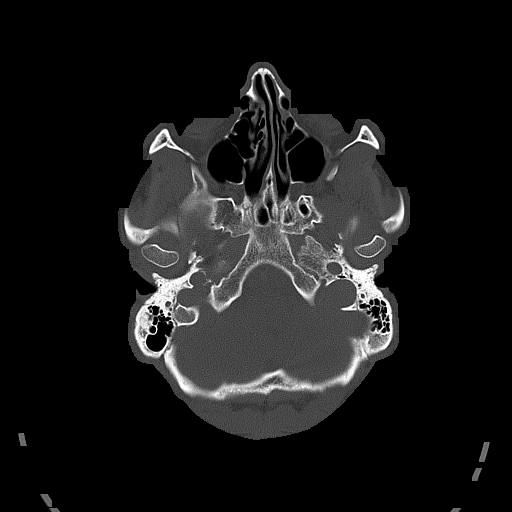
[im 17/82  bone]
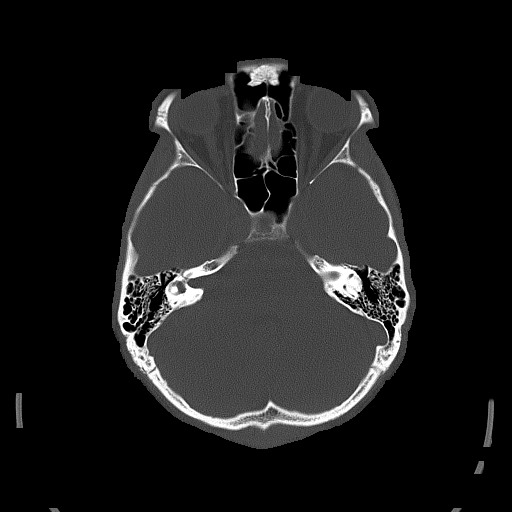
[im 25/82  bone]
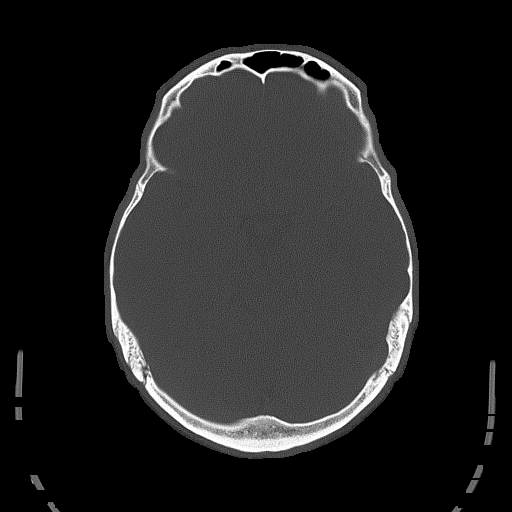

[Series 5: head without cor · coronal · non-contrast · 0.35mm/px · 3 of 72 slices shown]
[im 24/72  brain]
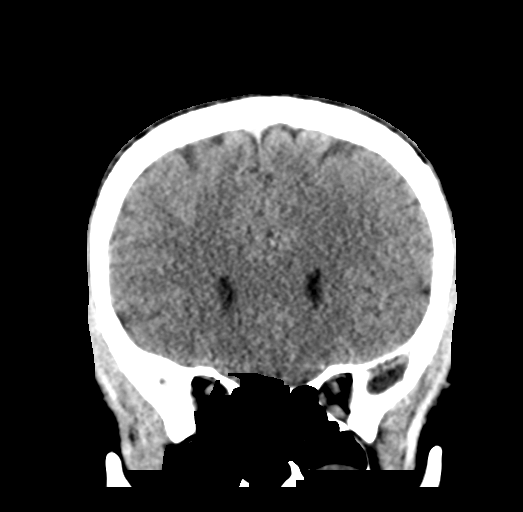
[im 32/72  brain]
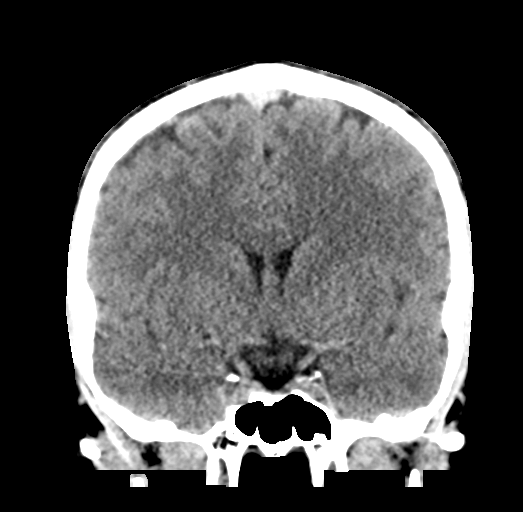
[im 40/72  brain]
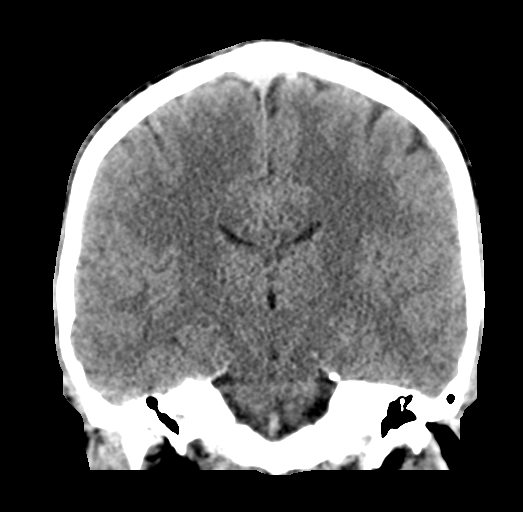

[Series 6: head without sag · sagittal · non-contrast · 0.36mm/px · 3 of 55 slices shown]
[im 19/55  brain]
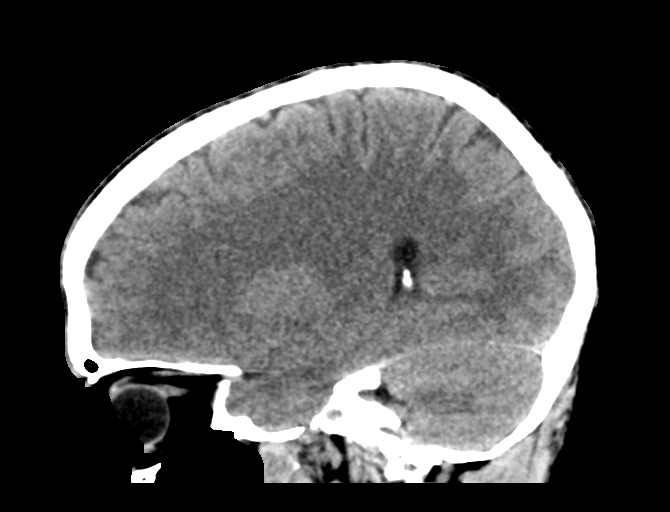
[im 28/55  brain]
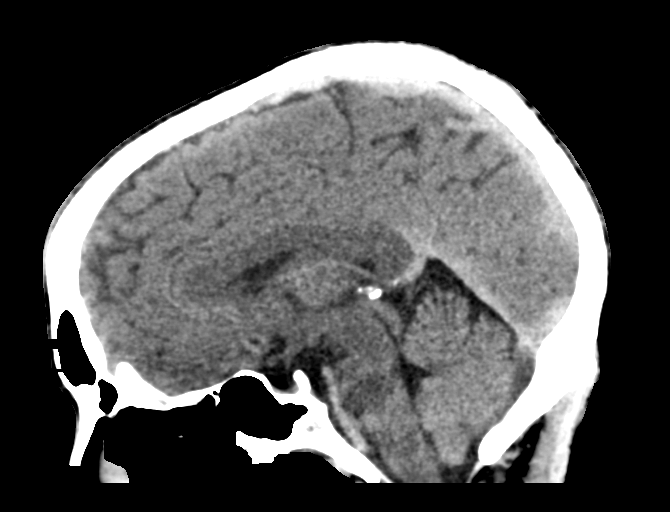
[im 37/55  brain]
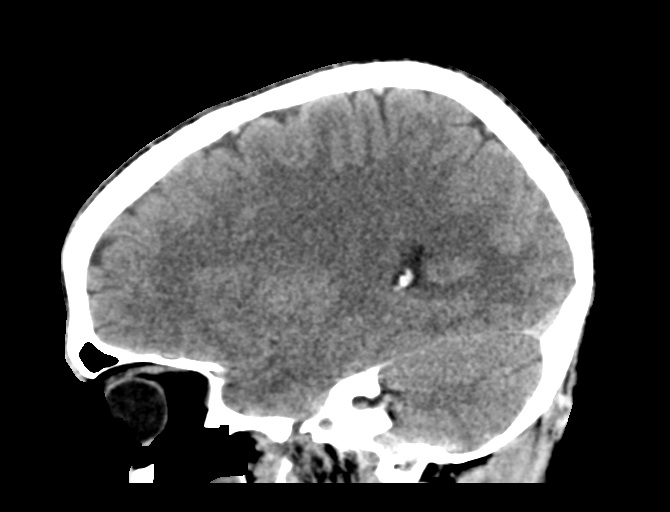

[16 of 47 positions shown; findings below may reference images not displayed]

FINDINGS: Brain: No evidence of acute infarction, hemorrhage, hydrocephalus,
extra-axial collection or mass lesion/mass effect.

Vascular: No hyperdense vessel or unexpected calcification.

Skull: Normal. Negative for fracture or focal lesion.

Sinuses/Orbits: Left maxillary mucous retention cyst versus
sinonasal polyp. Paranasal sinuses and mastoid air cells are
otherwise clear.

Other: None.
IMPRESSION: 1. No acute intracranial abnormality.
2. Left maxillary mucous retention cyst versus sinonasal polyp.

## 2022-02-05 IMAGING — MR MR HEAD WO/W CM
15 of 18 series · 34 of 48 positions shown · IV contrast (gadavist)
Comparison: None Available.

CLINICAL DATA: Left-sided numbness

EXAM:
MRI HEAD WITHOUT AND WITH CONTRAST
MRI CERVICAL SPINE WITHOUT AND WITH CONTRAST
TECHNIQUE: Multiplanar, multiecho pulse sequences of the brain and surrounding
structures, and cervical spine, to include the craniocervical
junction and cervicothoracic junction, were obtained without and
with intravenous contrast.
CONTRAST:  7.5mL GADAVIST GADOBUTROL 1 MMOL/ML IV SOLN

[Series 5: DWI · axial · 3.0mm · 0.88mm/px · z∈[-141,+12]mm · 5 of 108 slices shown (1 of 4)]
[im 1/108]
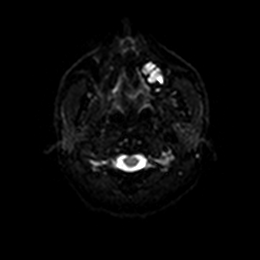
[im 27/108]
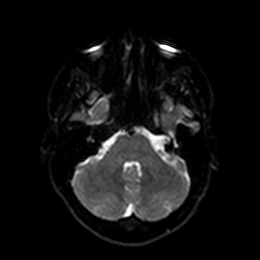
[im 54/108]
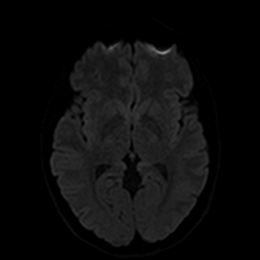
[im 81/108]
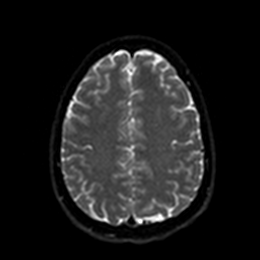
[im 108/108]
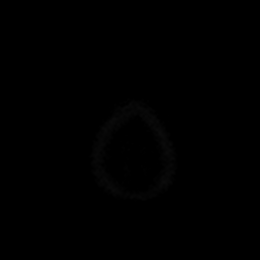

[Series 6: DWI · axial · 3.0mm · 0.88mm/px · z∈[-141,+12]mm · 2 of 53 slices shown (2 of 4)]
[im 1/53]
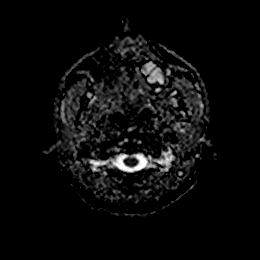
[im 53/53]
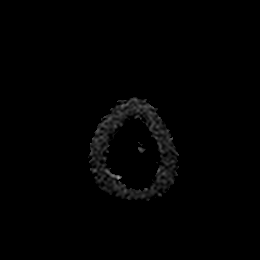

[Series 7: DWI · coronal · 4.0mm · 0.88mm/px · 3 of 76 slices shown (3 of 4)]
[im 1/76]
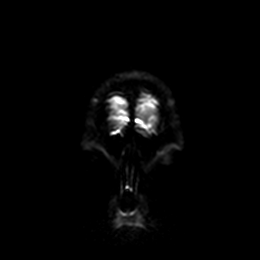
[im 38/76]
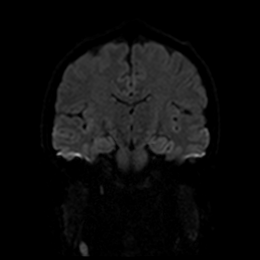
[im 76/76]
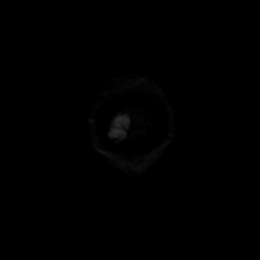

[Series 8: DWI · coronal · 4.0mm · 0.88mm/px · 1 of 38 slices shown (4 of 4)]
[im 1/38]
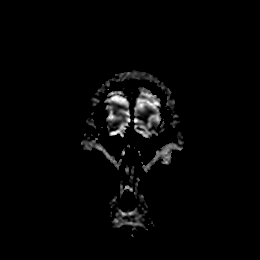

[Series 9: T1 · sagittal · 5.0mm · 0.75mm/px · 1 of 25 slices shown]
[im 1/25]
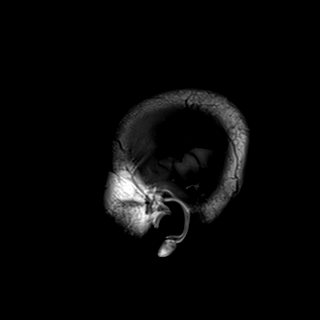

[Series 10: T2 · axial · 5.0mm · 0.72mm/px · 1 of 27 slices shown]
[im 1/27]
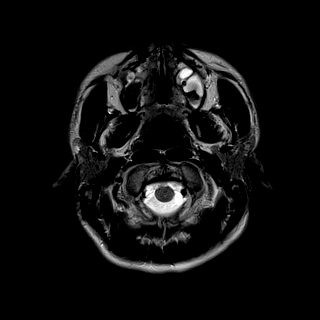

[Series 11: FLAIR · axial · 5.0mm · 0.45mm/px · 1 of 27 slices shown]
[im 1/27]
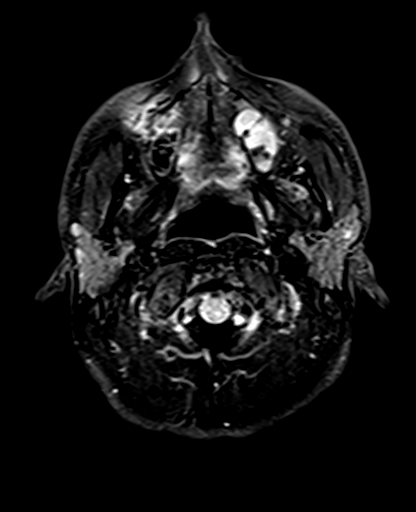

[Series 12: mag_images · axial · 3.0mm · 0.90mm/px · z∈[-144,+24]mm · 2 of 60 slices shown]
[im 1/60]
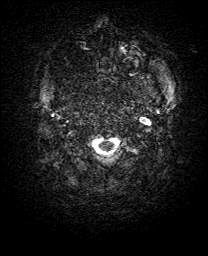
[im 60/60]
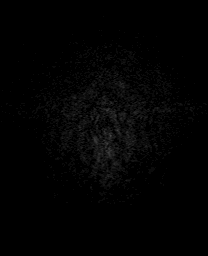

[Series 13: pha_images · axial · 3.0mm · 0.90mm/px · z∈[-144,+18]mm · 2 of 58 slices shown]
[im 1/58]
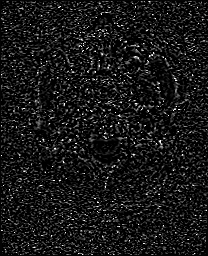
[im 58/58]
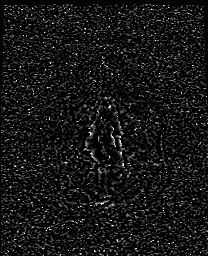

[Series 14: swi_images · axial · 3.0mm · 0.90mm/px · z∈[-144,+24]mm · 2 of 60 slices shown]
[im 1/60]
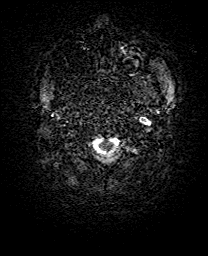
[im 60/60]
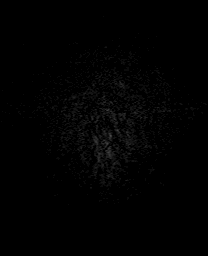

[Series 15: mip_images(sw) · axial · 24.0mm · 0.90mm/px · z∈[-134,+14]mm · 2 of 53 slices shown]
[im 1/53]
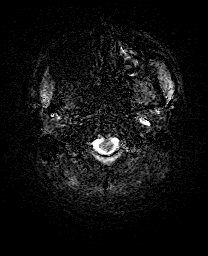
[im 53/53]
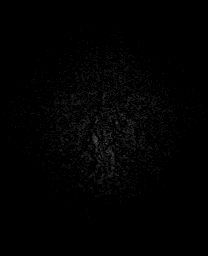

[Series 16: t2_space_dark-fluid_sag_p2_ns-ir · sagittal · 1.0mm · 0.49mm/px · 7 of 192 slices shown]
[im 1/192]
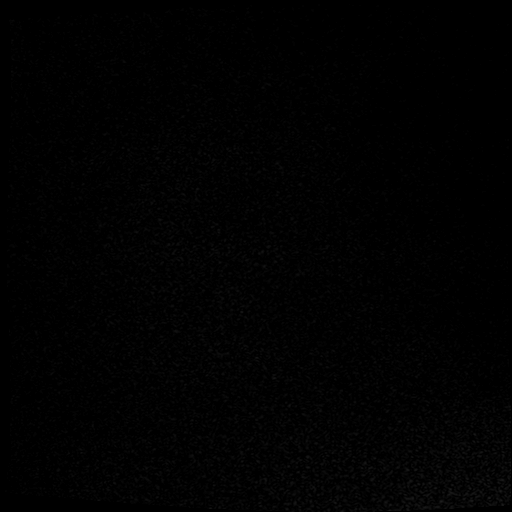
[im 32/192]
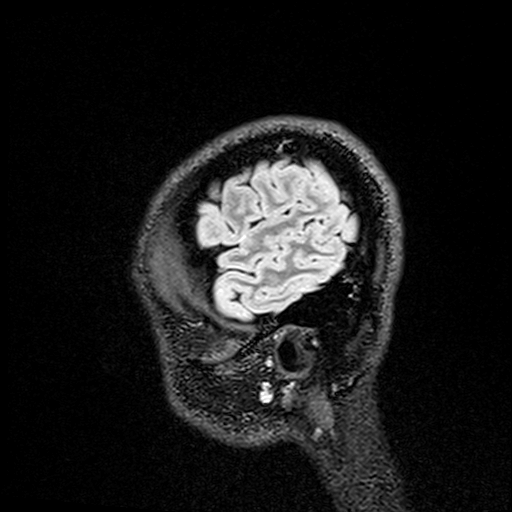
[im 64/192]
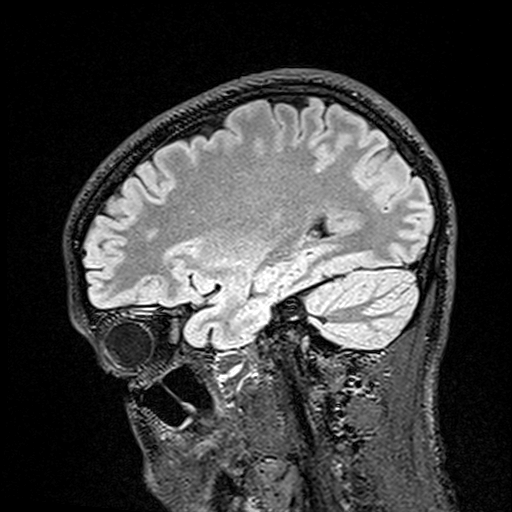
[im 96/192]
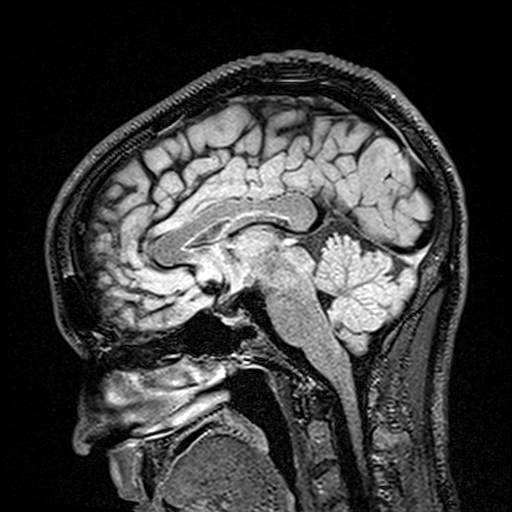
[im 128/192]
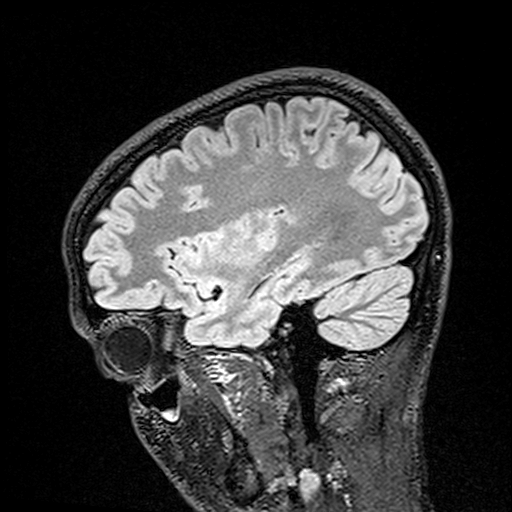
[im 160/192]
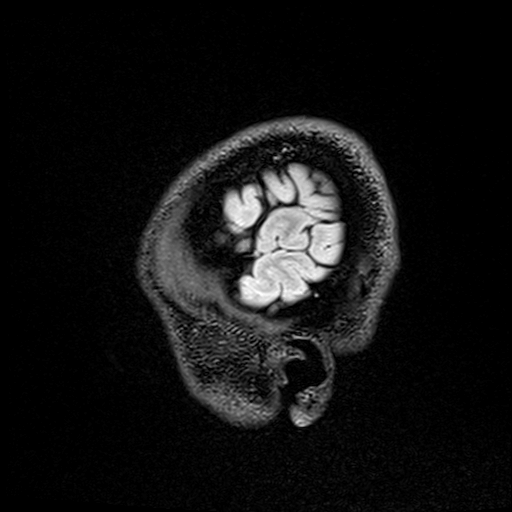
[im 192/192]
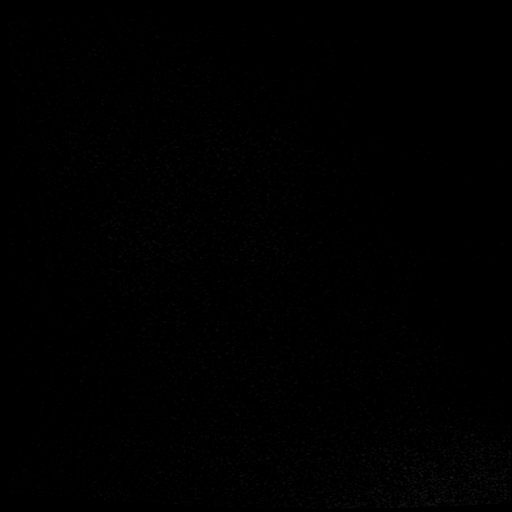

[Series 17: t2_space_dark-fluid_sag_p2_ns-ir_mpr_ axial · axial · 1.0mm · 0.45mm/px · z∈[-137,-78]mm · 3 of 155 slices shown]
[im 1/155]
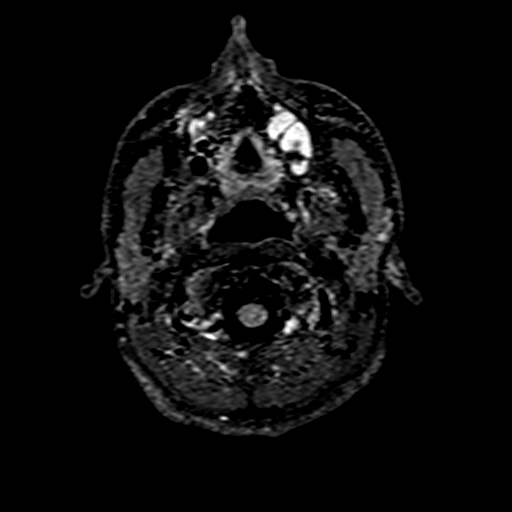
[im 31/155]
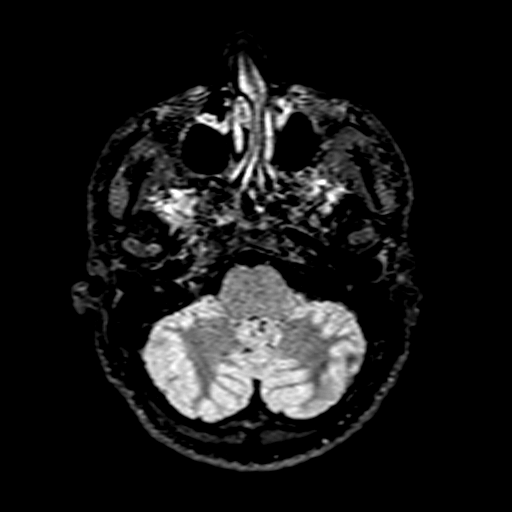
[im 62/155]
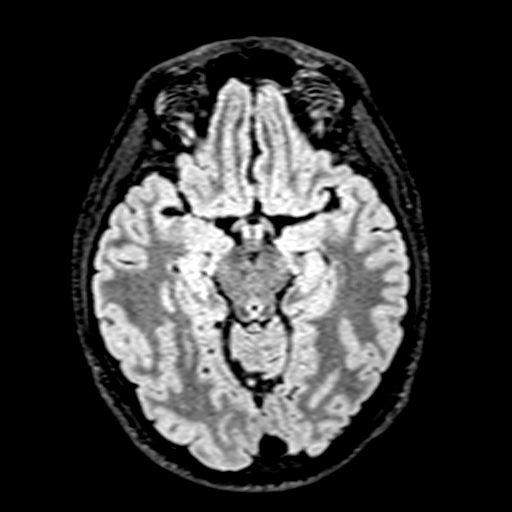

[Series 20: T2 post-contrast · coronal · 5.0mm · 0.72mm/px · 1 of 32 slices shown]
[im 1/32]
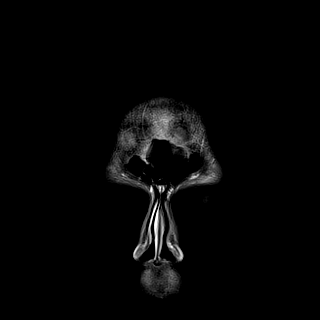

[Series 22: T1 post-contrast · coronal · 5.0mm · 0.34mm/px · 1 of 32 slices shown]
[im 1/32]
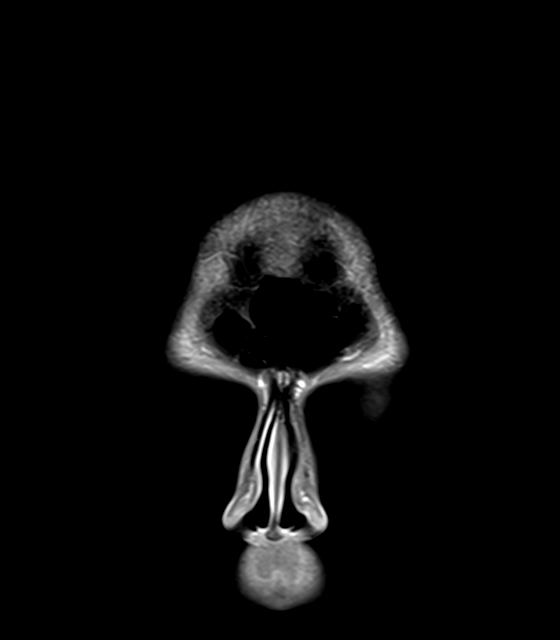

[34 of 48 positions shown; findings below may reference images not displayed]

FINDINGS: MRI HEAD FINDINGS

Brain: There is no acute infarction or intracranial hemorrhage.
There is no intracranial mass, mass effect, or edema. There is no
hydrocephalus or extra-axial fluid collection. Ventricles and sulci
are normal in size and configuration. Punctate focus of T2
hyperintensity in the right frontal subcortical white matter
reflecting nonspecific gliosis/demyelination. No abnormal
enhancement.

Vascular: Major vessel flow voids at the skull base are preserved.

Skull: Normal marrow signal is preserved.

Sinuses/Orbits: Left maxillary sinus retention cyst or polyp. Orbits
are unremarkable.

Other: Sella is unremarkable.  Mastoid air cells are clear.

MRI CERVICAL SPINE FINDINGS

Alignment: Preserved.

Vertebrae: Vertebral body heights are maintained. Marrow signal is
normal.

Cord: No abnormal signal.  No abnormal enhancement.

Posterior Fossa, vertebral arteries, paraspinal tissues:
Unremarkable.

Disc levels: Intervertebral disc heights and signal are maintained.
There is no disc herniation or canal or foraminal stenosis at any
level.
IMPRESSION: No acute or significant intracranial abnormality.

Normal imaging of the cervical spine.

## 2022-02-05 IMAGING — MR MR CERVICAL SPINE WO/W CM
6 of 8 series · 30 of 48 positions shown · IV contrast (gadavist)
Comparison: None Available.

CLINICAL DATA: Left-sided numbness

EXAM:
MRI HEAD WITHOUT AND WITH CONTRAST
MRI CERVICAL SPINE WITHOUT AND WITH CONTRAST
TECHNIQUE: Multiplanar, multiecho pulse sequences of the brain and surrounding
structures, and cervical spine, to include the craniocervical
junction and cervicothoracic junction, were obtained without and
with intravenous contrast.
CONTRAST:  7.5mL GADAVIST GADOBUTROL 1 MMOL/ML IV SOLN

[Series 9: T2 · sagittal · 3.0mm · 0.69mm/px · 3 of 15 slices shown (1 of 2)]
[im 1/15]
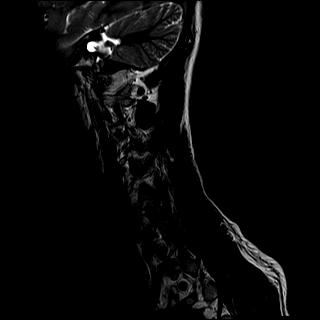
[im 8/15]
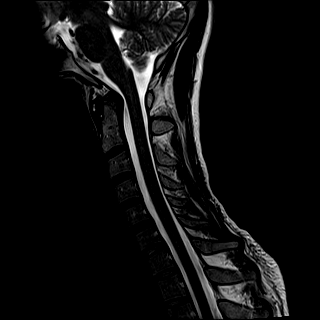
[im 15/15]
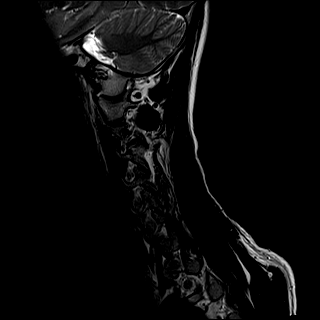

[Series 10: T1 · sagittal · 3.0mm · 0.69mm/px · 3 of 15 slices shown (1 of 2)]
[im 1/15]
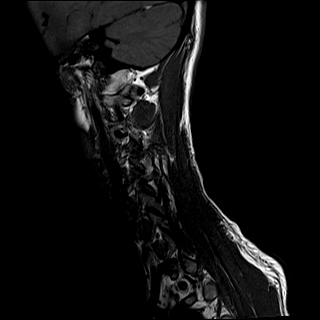
[im 8/15]
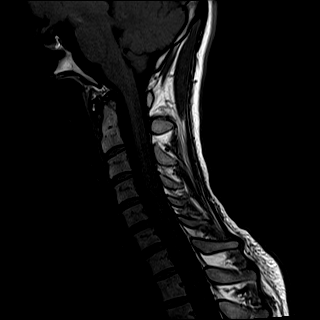
[im 15/15]
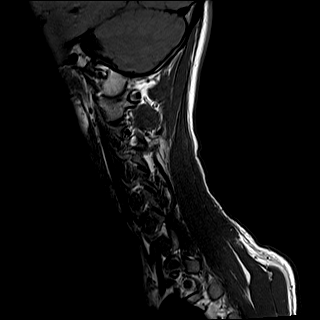

[Series 11: STIR · sagittal · 3.0mm · 0.86mm/px · 3 of 15 slices shown]
[im 1/15]
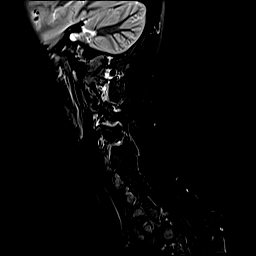
[im 8/15]
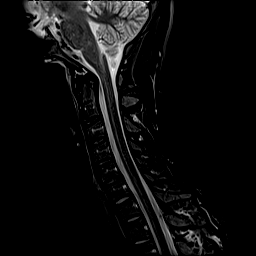
[im 15/15]
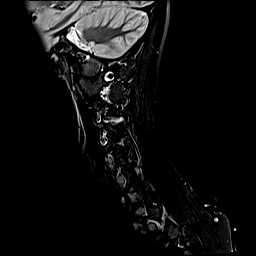

[Series 12: T2 · axial · 3.0mm · 0.66mm/px · z∈[-256,-138]mm · 9 of 40 slices shown (2 of 2)]
[im 1/40]
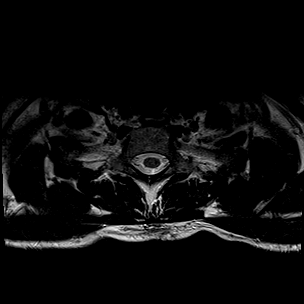
[im 5/40]
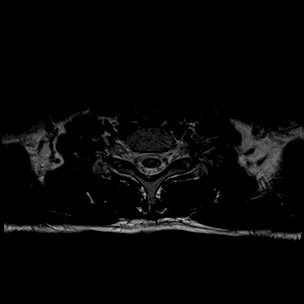
[im 10/40]
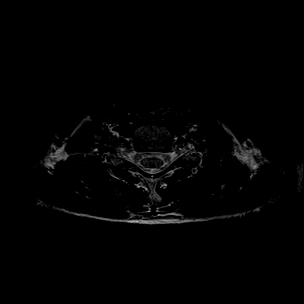
[im 15/40]
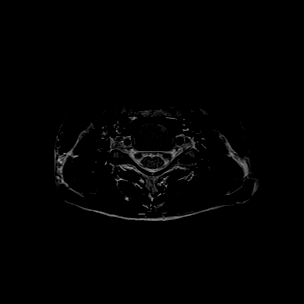
[im 20/40]
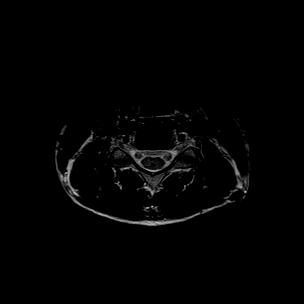
[im 25/40]
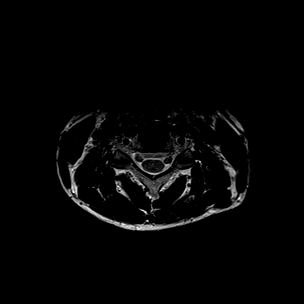
[im 30/40]
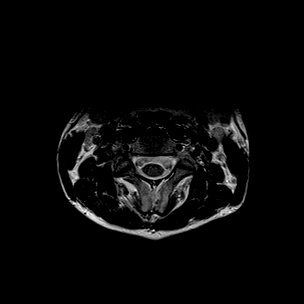
[im 35/40]
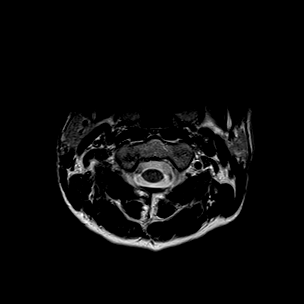
[im 40/40]
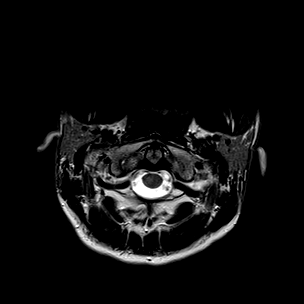

[Series 14: T1 · axial · 3.0mm · 0.39mm/px · z∈[-256,-138]mm · 9 of 40 slices shown (2 of 2)]
[im 1/40]
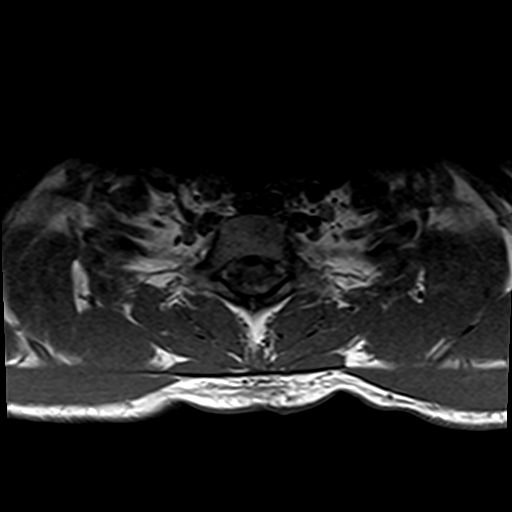
[im 5/40]
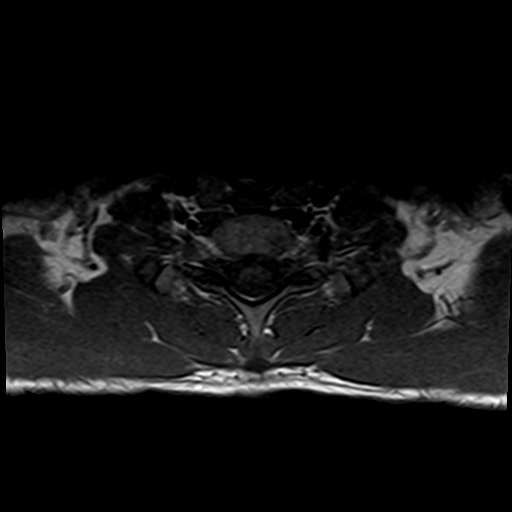
[im 10/40]
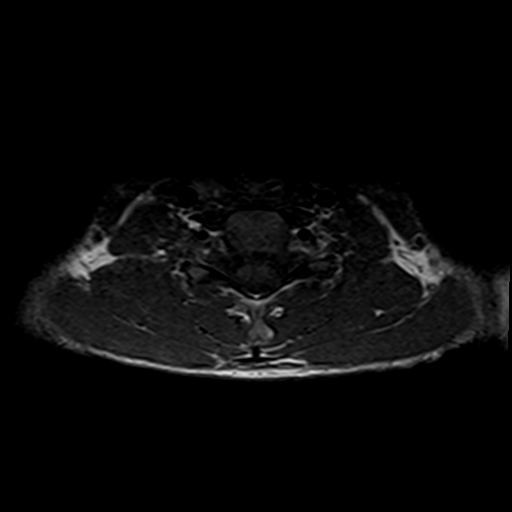
[im 15/40]
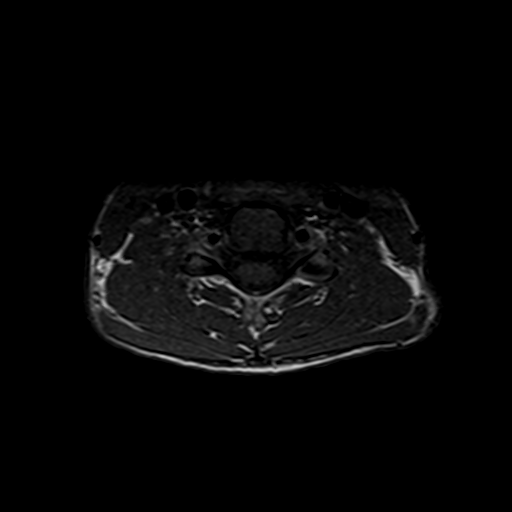
[im 20/40]
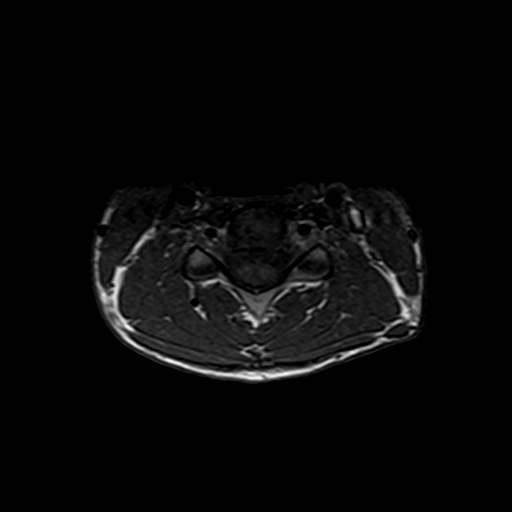
[im 25/40]
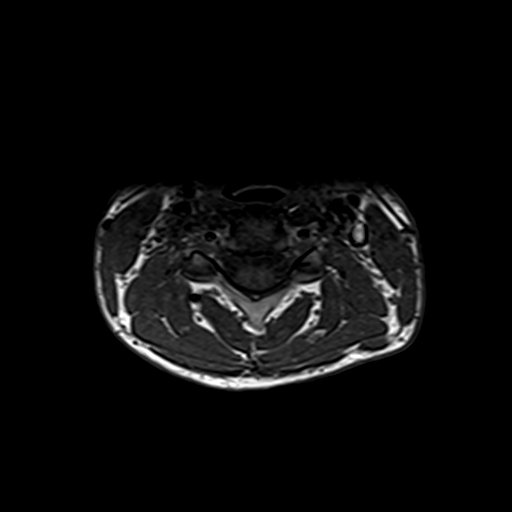
[im 30/40]
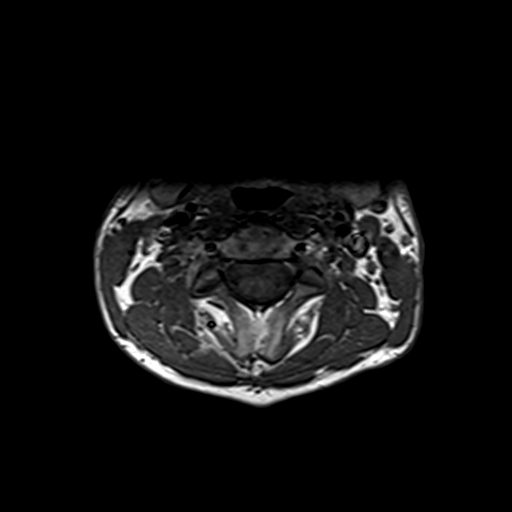
[im 35/40]
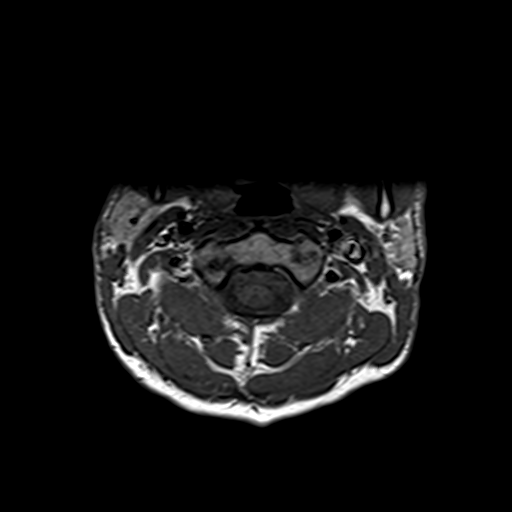
[im 40/40]
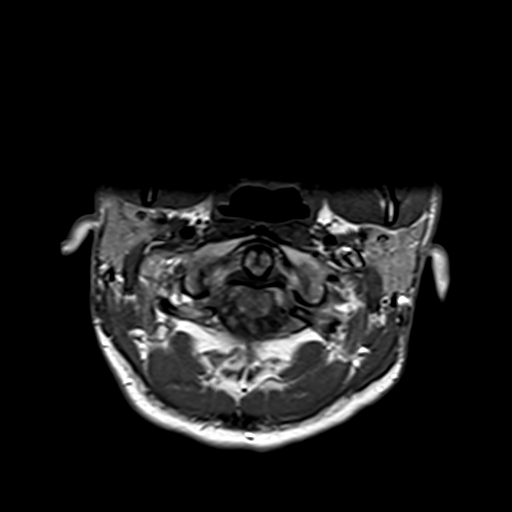

[Series 15: T1 fat-sat post-contrast · sagittal · 3.0mm · 0.43mm/px · 3 of 15 slices shown]
[im 1/15]
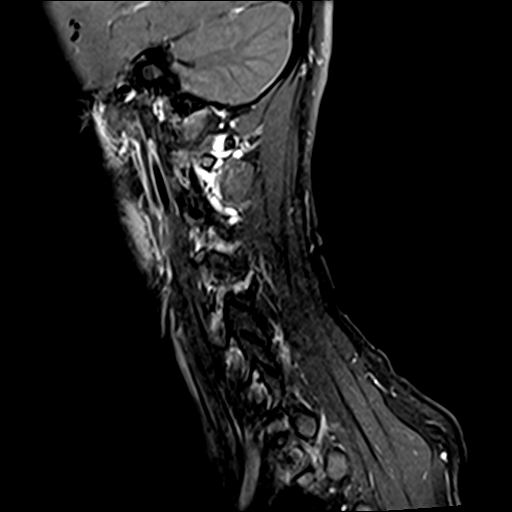
[im 8/15]
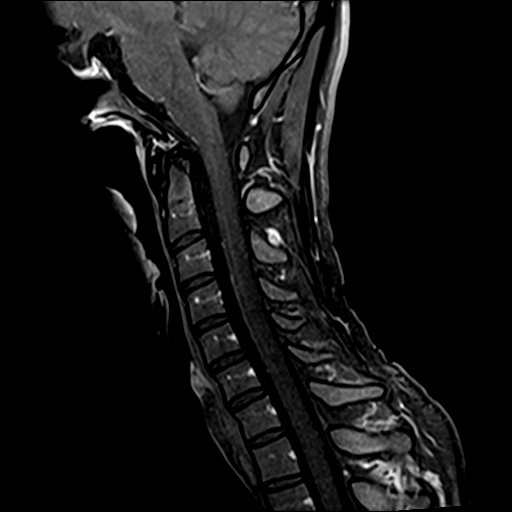
[im 15/15]
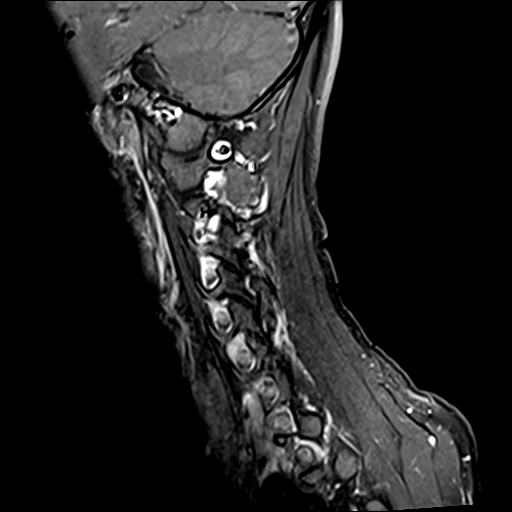

[30 of 48 positions shown; findings below may reference images not displayed]

FINDINGS: MRI HEAD FINDINGS

Brain: There is no acute infarction or intracranial hemorrhage.
There is no intracranial mass, mass effect, or edema. There is no
hydrocephalus or extra-axial fluid collection. Ventricles and sulci
are normal in size and configuration. Punctate focus of T2
hyperintensity in the right frontal subcortical white matter
reflecting nonspecific gliosis/demyelination. No abnormal
enhancement.

Vascular: Major vessel flow voids at the skull base are preserved.

Skull: Normal marrow signal is preserved.

Sinuses/Orbits: Left maxillary sinus retention cyst or polyp. Orbits
are unremarkable.

Other: Sella is unremarkable.  Mastoid air cells are clear.

MRI CERVICAL SPINE FINDINGS

Alignment: Preserved.

Vertebrae: Vertebral body heights are maintained. Marrow signal is
normal.

Cord: No abnormal signal.  No abnormal enhancement.

Posterior Fossa, vertebral arteries, paraspinal tissues:
Unremarkable.

Disc levels: Intervertebral disc heights and signal are maintained.
There is no disc herniation or canal or foraminal stenosis at any
level.
IMPRESSION: No acute or significant intracranial abnormality.

Normal imaging of the cervical spine.

## 2022-02-05 MED ORDER — GADOBUTROL 1 MMOL/ML IV SOLN
7.5000 mL | Freq: Once | INTRAVENOUS | Status: AC | PRN
  Administered 2022-02-05: 7.5 mL via INTRAVENOUS

## 2022-02-05 NOTE — ED Notes (Signed)
Pt transported to MRI 

## 2022-02-05 NOTE — ED Provider Triage Note (Signed)
Emergency Medicine Provider Triage Evaluation Note  Matthew Camacho , a 31 y.o. male  was evaluated in triage.  Pt complains of numbness to left lower leg onset 3 weeks ago. Noted 3 days ago that his numbness spread to his left arm and left sided face. He notes that since approximately 2-3 days ago he has had intermittent numbness to his left arm and left-sided face.  He notes that he feels that it is briefly resolved while he works out. Denies PMHx of CVA, TIA. Denies fever, chills, nausea, vomiting.  Review of Systems  Positive: As per HPI above Negative:   Physical Exam  BP 136/72 (BP Location: Right Arm)   Pulse 98   Temp 98.6 F (37 C) (Oral)   Resp 16   Ht 6' (1.829 m)   Wt 74.8 kg   SpO2 99%   BMI 22.38 kg/m  Gen:   Awake, no distress   Resp:  Normal effort  MSK:   Moves extremities without difficulty  Other:  Able to ambulate without assistance or difficulty.  No focal neurological deficits.  Negative pronator drift.  Medical Decision Making  Medically screening exam initiated at 10:55 AM.  Appropriate orders placed.  Matthew Camacho was informed that the remainder of the evaluation will be completed by another provider, this initial triage assessment does not replace that evaluation, and the importance of remaining in the ED until their evaluation is complete.  Work-up initiated   Kadey Mihalic A, PA-C 02/05/22 1129

## 2022-02-05 NOTE — ED Provider Notes (Signed)
[facility] EMERGENCY DEPARTMENT Provider Note  CSN: [id number] Arrival date & time: 02/05/22 1037  Chief Complaint(s) Numbness  HPI Matthew Camacho is a 31 y.o. 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  Patient states that over the last 3 weeks he has had a sending paresthesias that started in the left calf, but have since ascended to include the left rib cage, left arm.  He states that they do not involve the thigh or the face and he does not have any trouble breathing.  He denies any weakness or dropping of objects.  Denies recent viral illness, recent diarrheal illness, camping, tick bites or other systemic symptoms.   Past Medical History Past Medical History:  Diagnosis Date   Colitis    Patient Active Problem List   Diagnosis Date Noted   Encounter for annual physical exam 02/14/2021   Vitiligo 02/13/2021   Colitis 04/06/2019   Healthcare maintenance 04/06/2019   Home Medication(s) Prior to Admission medications   Medication Sig Start Date End Date Taking? Authorizing Provider  mesalamine (APRISO) 0.375 g 24 hr capsule Take 2 capsules (0.75 g total) by mouth 2 (two) times daily. 12/21/21   Sherrilyn Rist, MD  mesalamine (CANASA) 1000 MG suppository Place 1 suppository (1,000 mg total) rectally at bedtime. 09/26/21   Sherrilyn Rist, MD                                                                                                                                    Past Surgical History Past Surgical History:  Procedure Laterality Date   HERNIA REPAIR     Family History Family History  Problem Relation Age of Onset   Diabetes Maternal Grandfather    Vitiligo Paternal Grandfather    Colon cancer Neg Hx    Esophageal cancer Neg Hx    Rectal cancer Neg Hx    Stomach cancer Neg Hx     Social History Social History   Tobacco Use   Smoking status: Never   Smokeless  tobacco: Never  Vaping Use   Vaping Use: Never used  Substance Use Topics   Alcohol use: Yes    Alcohol/week: 7.0 standard drinks    Types: 7 Standard drinks or equivalent per week   Drug use: No   Allergies Patient has no known allergies.  Review of Systems Review of Systems  Neurological:  Positive for numbness.   Physical Exam Vital Signs  I have reviewed the triage vital signs BP 130/72 (BP Location: Right Arm)   Pulse 84   Temp 98.2 F (36.8 C) (Oral)   Resp 16   Ht 6' (1.829 m)   Wt 74.8 kg   SpO2 100%   BMI 22.38 kg/m   Physical Exam Vitals and nursing note reviewed.  Constitutional:      General: He is not in  acute distress.    Appearance: He is well-developed.  HENT:     Head: Normocephalic and atraumatic.  Eyes:     Conjunctiva/sclera: Conjunctivae normal.  Cardiovascular:     Rate and Rhythm: Normal rate and regular rhythm.     Heart sounds: No murmur heard. Pulmonary:     Effort: Pulmonary effort is normal. No respiratory distress.     Breath sounds: Normal breath sounds.  Abdominal:     Palpations: Abdomen is soft.     Tenderness: There is no abdominal tenderness.  Musculoskeletal:        General: No swelling.     Cervical back: Neck supple.  Skin:    General: Skin is warm and dry.     Capillary Refill: Capillary refill takes less than 2 seconds.  Neurological:     Mental Status: He is alert.     Cranial Nerves: No cranial nerve deficit.     Sensory: No sensory deficit.     Motor: No weakness.  Psychiatric:        Mood and Affect: Mood normal.    ED Results and Treatments Labs (all labs ordered are listed, but only abnormal results are displayed) Labs Reviewed  COMPREHENSIVE METABOLIC PANEL - Abnormal; Notable for the following components:      Result Value   Glucose, Bld 126 (*)    Total Bilirubin 1.7 (*)    All other components within normal limits  CBC WITH DIFFERENTIAL/PLATELET                                                                                                                           Radiology CT Head Wo Contrast  Result Date: 02/05/2022 CLINICAL DATA:  Headache.  Left-sided numbness EXAM: CT HEAD WITHOUT CONTRAST TECHNIQUE: Contiguous axial images were obtained from the base of the skull through the vertex without intravenous contrast. RADIATION DOSE REDUCTION: This exam was performed according to the departmental dose-optimization program which includes automated exposure control, adjustment of the mA and/or kV according to patient size and/or use of iterative reconstruction technique. COMPARISON:  None Available. FINDINGS: Brain: No evidence of acute infarction, hemorrhage, hydrocephalus, extra-axial collection or mass lesion/mass effect. Vascular: No hyperdense vessel or unexpected calcification. Skull: Normal. Negative for fracture or focal lesion. Sinuses/Orbits: Left maxillary mucous retention cyst versus sinonasal polyp. Paranasal sinuses and mastoid air cells are otherwise clear. Other: None. IMPRESSION: 1. No acute intracranial abnormality. 2. Left maxillary mucous retention cyst versus sinonasal polyp. Electronically Signed   By: Duanne Guess D.O.   On: 02/05/2022 12:20    Pertinent labs & imaging results that were available during my care of the patient were reviewed by me and considered in my medical decision making (see MDM for details).  Medications Ordered in ED Medications - No data to display  Procedures Procedures  (including critical care time)  Medical Decision Making / ED Course   This patient presents to the ED for concern of paresthesias, this involves an extensive number of treatment options, and is a complaint that carries with it a high risk of complications and morbidity.  The differential diagnosis includes peripheral neuropathy, autoimmune  neuropathy, MS, mass, transverse myelitis  MDM: Patient seen emergency room for evaluation of left-sided paresthesias.  Physical exam largely unremarkable with no appreciable weakness, no cranial nerve deficits and good two-point discrimination in the areas of a reported sensory deficit.  Laboratory evaluation initial head CT unremarkable.  Neurology consulted who recommends MRI brain and C-spine to rule out MS given his history of underlying autoimmune conditions.  These imaging studies were obtained and are reassuringly negative.  [facility]  Patient then dc with outpatient neurology follow-up   Additional history obtained: -Additional history obtained from mother -External records from outside source obtained and reviewed including: Chart review including previous notes, labs, imaging, consultation notes   Lab Tests: -I ordered, reviewed, and interpreted labs.   The pertinent results include:   Labs Reviewed  COMPREHENSIVE METABOLIC PANEL - Abnormal; Notable for the following components:      Result Value   Glucose, Bld 126 (*)    Total Bilirubin 1.7 (*)    All other components within normal limits  CBC WITH DIFFERENTIAL/PLATELET       Imaging Studies ordered: I ordered imaging studies including CT head, MRI brain and C-spine I independently visualized and interpreted imaging. I agree with the radiologist interpretation   Medicines ordered and prescription drug management: No orders of the defined types were placed in this encounter.   -I have reviewed the patients home medicines and have made adjustments as needed  Critical interventions none  Consultations Obtained: I requested consultation with the neurologist,  and discussed lab and imaging findings as well as pertinent plan - they recommend: MRI brain and C-spine   Social Determinants of Health:  Factors impacting patients care include: none   Reevaluation: After the  interventions noted above, I reevaluated the patient and found that they have :improved  Co morbidities that complicate the patient evaluation  Past Medical History:  Diagnosis Date   Colitis       Dispostion: I considered admission for this patient, but with negative work-up in the emergency department he does not meet inpatient criteria for admission is safe for discharge to outpatient follow-up     Final Clinical Impression(s) / ED Diagnoses Final diagnoses:  None     @PCDICTATION @    Glendora ScoreKommor, Nayellie Sanseverino, MD 02/05/22 1918

## 2022-02-05 NOTE — ED Triage Notes (Signed)
Pt. Stated, Ive had left side numbness for 3 weeks. It 1st started in my left leg from the knee down and 3 days ago its gone all the way up my body only on the left side. Started in my face yesterday. Pt alert and oriented x 4 . . Pt. Neg for weakness.

## 2022-02-05 NOTE — ED Notes (Signed)
PT is still in MRI at this time

## 2022-02-12 ENCOUNTER — Encounter: Payer: Self-pay | Admitting: Diagnostic Neuroimaging

## 2022-02-12 ENCOUNTER — Ambulatory Visit: Payer: Commercial Managed Care - PPO | Admitting: Diagnostic Neuroimaging

## 2022-02-12 VITALS — BP 118/77 | HR 83 | Ht 72.0 in | Wt 165.0 lb

## 2022-02-12 DIAGNOSIS — R200 Anesthesia of skin: Secondary | ICD-10-CM | POA: Diagnosis not present

## 2022-02-12 NOTE — Progress Notes (Signed)
[facility]  PATIENT: Matthew Camacho DOB: [birth year]  REFERRING CLINICIAN: Towanda OctavePatel, Poonam, MD HISTORY FROM: patient  REASON FOR VISIT: new consult    HISTORICAL  CHIEF COMPLAINT:  Chief Complaint  Patient presents with   New Patient (Initial Visit)    Rm 6 with wife Matthew Davenport(Sandra) here for consult on paresthesia- pt reports on 02/05/2022  he went to ER due to left side weakness and numbness. Pt reports sx remain the same today. Also reports blurry vision hard to tell if one eye is worse than the other.        HISTORY OF PRESENT ILLNESS:   31 year old male here for evaluation of left-sided numbness and abnormal sensation.  Symptoms started around 4 weeks ago.  Initially symptoms affected left leg from knee down to ankle.  Then symptoms noted in the left arm, left chest, left face.  Patient went to emergency room for evaluation on 02/05/2022.  Also having some blurred vision, brain fog and headaches.  Had MRI of the brain and cervical spine which were unremarkable.  No prodromal accidents injuries or traumas.  Has some mild history of headaches in the past.  Has history of autoimmune colitis for past 4 years.  Has been on suppository medical treatment for last 5 months.    REVIEW OF SYSTEMS: Full 14 system review of systems performed and negative with exception of: as per HPI.  ALLERGIES: No Known Allergies  HOME MEDICATIONS: Outpatient Medications Prior to Visit  Medication Sig Dispense Refill   mesalamine (APRISO) 0.375 g 24 hr capsule Take 2 capsules (0.75 g total) by mouth 2 (two) times daily. 360 capsule 3   mesalamine (CANASA) 1000 MG suppository Place 1 suppository (1,000 mg total) rectally at bedtime. 90 suppository 3   No facility-administered medications prior to visit.    PAST MEDICAL HISTORY: Past Medical History:  Diagnosis Date   Colitis     PAST SURGICAL HISTORY: Past Surgical History:  Procedure Laterality Date   HERNIA REPAIR      FAMILY  HISTORY: Family History  Problem Relation Age of Onset   Diabetes Maternal Grandfather    Vitiligo Paternal Grandfather    Colon cancer Neg Hx    Esophageal cancer Neg Hx    Rectal cancer Neg Hx    Stomach cancer Neg Hx     SOCIAL HISTORY: Social History   Socioeconomic History   Marital status: Married    Spouse name: Not on file   Number of children: Not on file   Years of education: Not on file   Highest education level: Not on file  Occupational History   Not on file  Tobacco Use   Smoking status: Never   Smokeless tobacco: Never  Vaping Use   Vaping Use: Never used  Substance and Sexual Activity   Alcohol use: Yes    Alcohol/week: 7.0 standard drinks of alcohol    Types: 7 Standard drinks or equivalent per week   Drug use: No   Sexual activity: Yes  Other Topics Concern   Not on file  Social History Narrative   Right handed   Caffeine 2 cups per day    Social Determinants of Health   Financial Resource Strain: Not on file  Food Insecurity: Not on file  Transportation Needs: Not on file  Physical Activity: Not on file  Stress: Not on file  Social Connections: Not on file  Intimate Partner Violence: Not on file     PHYSICAL EXAM  GENERAL  EXAM/CONSTITUTIONAL: Vitals:  Vitals:   02/12/22 1504  BP: 118/77  Pulse: 83  Weight: 165 lb (74.8 kg)  Height: 6' (1.829 m)   Body mass index is 22.38 kg/m. Wt Readings from Last 3 Encounters:  02/12/22 165 lb (74.8 kg)  02/05/22 165 lb (74.8 kg)  09/26/21 162 lb 9.6 oz (73.8 kg)   Patient is in no distress; well developed, nourished and groomed; neck is supple  CARDIOVASCULAR: Examination of carotid arteries is normal; no carotid bruits Regular rate and rhythm, no murmurs Examination of peripheral vascular system by observation and palpation is normal  EYES: Ophthalmoscopic exam of optic discs and posterior segments is normal; no papilledema or hemorrhages No results found.  MUSCULOSKELETAL: Gait,  strength, tone, movements noted in Neurologic exam below  NEUROLOGIC: MENTAL STATUS:      No data to display         awake, alert, oriented to person, place and time recent and remote memory intact normal attention and concentration language fluent, comprehension intact, naming intact fund of knowledge appropriate  CRANIAL NERVE:  2nd - no papilledema on fundoscopic exam 2nd, 3rd, 4th, 6th - pupils equal and reactive to light, visual fields full to confrontation, extraocular muscles intact, no nystagmus 5th - facial sensation symmetric 7th - facial strength symmetric 8th - hearing intact 9th - palate elevates symmetrically, uvula midline 11th - shoulder shrug symmetric 12th - tongue protrusion midline  MOTOR:  normal bulk and tone, full strength in the BUE, BLE  SENSORY:  normal and symmetric to light touch, pinprick, temperature, vibration  COORDINATION:  finger-nose-finger, fine finger movements normal  REFLEXES:  deep tendon reflexes present and symmetric  GAIT/STATION:  narrow based gait     DIAGNOSTIC DATA (LABS, IMAGING, TESTING) - I reviewed patient records, labs, notes, testing and imaging myself where available.  Lab Results  Component Value Date   WBC 8.8 02/05/2022   HGB 15.4 02/05/2022   HCT 45.4 02/05/2022   MCV 88.5 02/05/2022   PLT 251 02/05/2022      Component Value Date/Time   NA 140 02/05/2022 1136   K 4.0 02/05/2022 1136   CL 104 02/05/2022 1136   CO2 25 02/05/2022 1136   GLUCOSE 126 (H) 02/05/2022 1136   BUN 17 02/05/2022 1136   CREATININE 0.98 02/05/2022 1136   CALCIUM 9.7 02/05/2022 1136   PROT 7.7 02/05/2022 1136   ALBUMIN 4.8 02/05/2022 1136   AST 21 02/05/2022 1136   ALT 20 02/05/2022 1136   ALKPHOS 64 02/05/2022 1136   BILITOT 1.7 (H) 02/05/2022 1136   GFRNONAA >60 02/05/2022 1136   GFRAA >60 02/16/2018 1926   No results found for: "CHOL", "HDL", "LDLCALC", "LDLDIRECT", "TRIG", "CHOLHDL" No results found for:  "HGBA1C" No results found for: "VITAMINB12" No results found for: "TSH"   02/05/22 MRI brain / cervical - No acute or significant intracranial abnormality. - Normal imaging of the cervical spine.    ASSESSMENT AND PLAN  31 y.o. year old male here with abnormal sensation left face, left arm and left leg since May 2023.  Also with mild blurred vision and brain fog symptoms.  Neurologic examination and MRI of the brain and cervical spine are unremarkable.  Unclear etiology but could represent migraine phenomenon versus other benign paresthesias.   Dx:  1. Left sided numbness      PLAN:  -Reassured patient; recommend to monitor symptoms over the next 4 weeks for resolution; may consider repeat MRI of the brain or EMG nerve  conduction study in future   Return for pending if symptoms worsen or fail to improve.    Suanne Marker, MD 02/12/2022, 5:06 PM Certified in Neurology, Neurophysiology and Neuroimaging  [facility] [address] [place], [place] [address] [phone]

## 2022-02-13 ENCOUNTER — Ambulatory Visit (INDEPENDENT_AMBULATORY_CARE_PROVIDER_SITE_OTHER): Payer: Commercial Managed Care - PPO | Admitting: Family Medicine

## 2022-02-13 ENCOUNTER — Encounter: Payer: Self-pay | Admitting: Family Medicine

## 2022-02-13 VITALS — BP 111/71 | HR 70 | Ht 72.0 in | Wt 162.8 lb

## 2022-02-13 DIAGNOSIS — L80 Vitiligo: Secondary | ICD-10-CM | POA: Diagnosis not present

## 2022-02-13 DIAGNOSIS — Z0000 Encounter for general adult medical examination without abnormal findings: Secondary | ICD-10-CM

## 2022-02-13 NOTE — Progress Notes (Signed)
    SUBJECTIVE:   Chief compliant/HPI: annual examination  Willy Pinkerton is a 31 y.o. who presents today for an annual exam.   Review of systems form notable for intermittent paresthesias the patient has been evaluated by neurology for.  He notes that it started out in his leg area on the left side and then he began having intermittent left arm paresthesias.  They occur intermittently and do not happen every day necessarily.  He does note that when he is really busy that he tends to not notice them and is wondering if it could be related to anxiety, though he has never had any issue with this before.   Updated history tabs and problem list .   OBJECTIVE:   BP 111/71   Pulse 70   Ht 6' (1.829 m)   Wt 162 lb 12.8 oz (73.8 kg)   SpO2 100%   BMI 22.08 kg/m   Gen: well-appearing, NAD CV: RRR, no m/r/g appreciated, no peripheral edema Pulm: CTAB, no wheezes/crackles GI: soft, non-tender, non-distended Neuro: A&O x3, no focal neurologic deficit  ASSESSMENT/PLAN:   Intermittent paresthesias of left arm and left leg Has already been evaluated by neurology and had an MRI of the cervical spine and brain that were negative.  Plans at this time is to follow-up if persistent in the next 4 weeks.  Consideration for a somatic component to this, patient is interested in counseling to determine if that could be contributing. - Gave resources for [email or web address] - No need for medications at this time - Follow-up with neuro as instructed if symptoms persist  Annual Examination  See AVS for age appropriate recommendations.   PHQ score 1, reviewed and discussed. Blood pressure reviewed and at goal .  Asked about intimate partner violence and patient reports none.  Advanced directives meant to complete them before but is still interested in obtaining these   Considered the following items based upon USPSTF recommendations: HIV testing:  previously completed, not high risk  Hepatitis C:   previously completed Hepatitis B:  not high risk Syphilis if at high risk:  not high risk GC/CT not at high risk and not ordered. Lipid panel (nonfasting or fasting) discussed based upon AHA recommendations and not ordered.  Consider repeat every 4-6 years.  Reviewed risk factors for latent tuberculosis and not indicated  Immunizations up-to-date  Follow up in 1 year or sooner if indicated.    Evelena Leyden, DO [facility]

## 2022-02-13 NOTE — Patient Instructions (Signed)
Everything looks great today, keep doing the journal that was recommended by the ER doctor.  You can call your insurance or look for a website on the back of your insurance card that can help you find out your benefits.  You can also go to psychologytoday.[email or web address] and find a provider through your insurance that way as well.

## 2022-02-15 ENCOUNTER — Encounter: Payer: Commercial Managed Care - PPO | Admitting: Family Medicine

## 2022-02-21 ENCOUNTER — Telehealth: Payer: Self-pay | Admitting: Diagnostic Neuroimaging

## 2022-02-21 NOTE — Telephone Encounter (Signed)
Pt called requesting a second opinion with another provider. Pt states he was recommended Dr. Lucia Gaskins or Dr. Epimenio Foot. Please advise.

## 2022-03-11 ENCOUNTER — Encounter: Payer: Self-pay | Admitting: Diagnostic Neuroimaging

## 2022-03-11 ENCOUNTER — Telehealth: Payer: Self-pay | Admitting: Diagnostic Neuroimaging

## 2022-03-11 NOTE — Telephone Encounter (Signed)
Have schedule pt appt due to still feeling tingly and weak in both the lower left leg/foot and left hand/forearm and have now noticed some reduced coordination in the left hand (plate slipping from hand, dropping phone,

## 2022-03-11 NOTE — Telephone Encounter (Signed)
Noted  

## 2022-03-21 ENCOUNTER — Encounter: Payer: Self-pay | Admitting: Gastroenterology

## 2022-03-26 ENCOUNTER — Ambulatory Visit: Payer: Commercial Managed Care - PPO | Admitting: Diagnostic Neuroimaging

## 2022-03-26 ENCOUNTER — Encounter: Payer: Self-pay | Admitting: Diagnostic Neuroimaging

## 2022-03-26 VITALS — BP 120/71 | HR 69 | Ht 72.0 in | Wt 164.2 lb

## 2022-03-26 DIAGNOSIS — R200 Anesthesia of skin: Secondary | ICD-10-CM

## 2022-03-26 DIAGNOSIS — R202 Paresthesia of skin: Secondary | ICD-10-CM | POA: Diagnosis not present

## 2022-03-26 NOTE — Patient Instructions (Signed)
-   check labs - consider rheum evaluation - consider EMG/NCS - follow up bilirubin with GI

## 2022-03-26 NOTE — Progress Notes (Signed)
[facility]  PATIENT: Matthew Camacho DOB: 20-Jul-1991  REFERRING CLINICIAN: Darral Dash, DO HISTORY FROM: patient and wife REASON FOR VISIT: follow up   HISTORICAL  CHIEF COMPLAINT:  Chief Complaint  Patient presents with   Left sided numbness    Rm 7 Early FU d/t no improvement, wife- Dois Davenport "burning joint pain in wrists, ankles, down left arm; left leg numbness; eye exam was good"    HISTORY OF PRESENT ILLNESS:   UPDATE (03/26/22, VRP): Since last visit, doing better with vision and brain fog. Numbness intermitent and continues. Now with some wrist and ankle aching joint pains.   PRIOR HPI: 31 year old male here for evaluation of left-sided numbness and abnormal sensation.  Symptoms started around 4 weeks ago.  Initially symptoms affected left leg from knee down to ankle.  Then symptoms noted in the left arm, left chest, left face.  Patient went to emergency room for evaluation on 02/05/2022.  Also having some blurred vision, brain fog and headaches.  Had MRI of the brain and cervical spine which were unremarkable.  No prodromal accidents injuries or traumas.  Has some mild history of headaches in the past.  Has history of autoimmune colitis for past 4 years.  Has been on suppository medical treatment for last 5 months.    REVIEW OF SYSTEMS: Full 14 system review of systems performed and negative with exception of: as per HPI.  ALLERGIES: No Known Allergies  HOME MEDICATIONS: Outpatient Medications Prior to Visit  Medication Sig Dispense Refill   mesalamine (APRISO) 0.375 g 24 hr capsule Take 2 capsules (0.75 g total) by mouth 2 (two) times daily. 360 capsule 3   mesalamine (CANASA) 1000 MG suppository Place 1 suppository (1,000 mg total) rectally at bedtime. 90 suppository 3   No facility-administered medications prior to visit.    PAST MEDICAL HISTORY: Past Medical History:  Diagnosis Date   Colitis     PAST SURGICAL HISTORY: Past Surgical  History:  Procedure Laterality Date   HERNIA REPAIR      FAMILY HISTORY: Family History  Problem Relation Age of Onset   Diabetes Maternal Grandfather    Vitiligo Paternal Grandfather    Colon cancer Neg Hx    Esophageal cancer Neg Hx    Rectal cancer Neg Hx    Stomach cancer Neg Hx     SOCIAL HISTORY: Social History   Socioeconomic History   Marital status: Married    Spouse name: Dois Davenport   Number of children: 1   Years of education: Not on file   Highest education level: Not on file  Occupational History   Not on file  Tobacco Use   Smoking status: Never   Smokeless tobacco: Never  Vaping Use   Vaping Use: Never used  Substance and Sexual Activity   Alcohol use: Yes    Alcohol/week: 7.0 standard drinks of alcohol    Types: 7 Standard drinks or equivalent per week   Drug use: No   Sexual activity: Yes  Other Topics Concern   Not on file  Social History Narrative   Lives with wife   Right handed   Caffeine 2 cups per day    Social Determinants of Health   Financial Resource Strain: Not on file  Food Insecurity: Not on file  Transportation Needs: Not on file  Physical Activity: Not on file  Stress: Not on file  Social Connections: Not on file  Intimate Partner Violence: Not on file     PHYSICAL  EXAM  GENERAL EXAM/CONSTITUTIONAL: Vitals:  Vitals:   03/26/22 1248  BP: 120/71  Pulse: 69  Weight: 164 lb 3.2 oz (74.5 kg)  Height: 6' (1.829 m)   Body mass index is 22.27 kg/m. Wt Readings from Last 3 Encounters:  03/26/22 164 lb 3.2 oz (74.5 kg)  02/13/22 162 lb 12.8 oz (73.8 kg)  02/12/22 165 lb (74.8 kg)   Patient is in no distress; well developed, nourished and groomed; neck is supple  CARDIOVASCULAR: Examination of carotid arteries is normal; no carotid bruits Regular rate and rhythm, no murmurs Examination of peripheral vascular system by observation and palpation is normal  EYES: Ophthalmoscopic exam of optic discs and posterior segments  is normal; no papilledema or hemorrhages No results found.  MUSCULOSKELETAL: Gait, strength, tone, movements noted in Neurologic exam below  NEUROLOGIC: MENTAL STATUS:      No data to display         awake, alert, oriented to person, place and time recent and remote memory intact normal attention and concentration language fluent, comprehension intact, naming intact fund of knowledge appropriate  CRANIAL NERVE:  2nd - no papilledema on fundoscopic exam 2nd, 3rd, 4th, 6th - pupils equal and reactive to light, visual fields full to confrontation, extraocular muscles intact, no nystagmus 5th - facial sensation symmetric 7th - facial strength symmetric 8th - hearing intact 9th - palate elevates symmetrically, uvula midline 11th - shoulder shrug symmetric 12th - tongue protrusion midline  MOTOR:  normal bulk and tone, full strength in the BUE, BLE  SENSORY:  normal and symmetric to light touch, temperature, vibration  COORDINATION:  finger-nose-finger, fine finger movements normal  REFLEXES:  deep tendon reflexes present and symmetric  GAIT/STATION:  narrow based gait; ROMBERG NEG     DIAGNOSTIC DATA (LABS, IMAGING, TESTING) - I reviewed patient records, labs, notes, testing and imaging myself where available.  Lab Results  Component Value Date   WBC 8.8 02/05/2022   HGB 15.4 02/05/2022   HCT 45.4 02/05/2022   MCV 88.5 02/05/2022   PLT 251 02/05/2022      Component Value Date/Time   NA 140 02/05/2022 1136   K 4.0 02/05/2022 1136   CL 104 02/05/2022 1136   CO2 25 02/05/2022 1136   GLUCOSE 126 (H) 02/05/2022 1136   BUN 17 02/05/2022 1136   CREATININE 0.98 02/05/2022 1136   CALCIUM 9.7 02/05/2022 1136   PROT 7.7 02/05/2022 1136   ALBUMIN 4.8 02/05/2022 1136   AST 21 02/05/2022 1136   ALT 20 02/05/2022 1136   ALKPHOS 64 02/05/2022 1136   BILITOT 1.7 (H) 02/05/2022 1136   GFRNONAA >60 02/05/2022 1136   GFRAA >60 02/16/2018 1926   No results found  for: "CHOL", "HDL", "LDLCALC", "LDLDIRECT", "TRIG", "CHOLHDL" No results found for: "HGBA1C" No results found for: "VITAMINB12" No results found for: "TSH"   02/05/22 MRI brain / cervical - No acute or significant intracranial abnormality. - Normal imaging of the cervical spine.    ASSESSMENT AND PLAN  31 y.o. year old male here with abnormal sensation left face, left arm and left leg since May 2023.  Also with mild blurred vision and brain fog symptoms.  Neurologic examination and MRI of the brain and cervical spine are unremarkable.  Unclear etiology but could represent migraine phenomenon versus other benign paresthesias.   Dx:  1. Numbness and tingling       PLAN:  NUMBNESS / ACHING JOINTS - check labs - consider rheum evaluation - consider EMG/NCS -  follow up elevated bilirubin with GI  Orders Placed This Encounter  Procedures   Vitamin B12   A1c   TSH   SPEP with IFE   ANA w/Reflex   SSA, SSB   Uric Acid   Vitamin B1   Vitamin B6   ANCA Profile   Return for pending if symptoms worsen or fail to improve, pending test results.    Suanne Marker, MD 03/26/2022, 1:41 PM Certified in Neurology, Neurophysiology and Neuroimaging  [facility] [address] [place], [place] [address] [phone]

## 2022-04-02 LAB — MULTIPLE MYELOMA PANEL, SERUM
Albumin SerPl Elph-Mcnc: 4.2 g/dL (ref 2.9–4.4)
Albumin/Glob SerPl: 1.6 (ref 0.7–1.7)
Alpha 1: 0.2 g/dL (ref 0.0–0.4)
Alpha2 Glob SerPl Elph-Mcnc: 0.6 g/dL (ref 0.4–1.0)
B-Globulin SerPl Elph-Mcnc: 1 g/dL (ref 0.7–1.3)
Gamma Glob SerPl Elph-Mcnc: 1.1 g/dL (ref 0.4–1.8)
Globulin, Total: 2.8 g/dL (ref 2.2–3.9)
IgA/Immunoglobulin A, Serum: 257 mg/dL (ref 90–386)
IgG (Immunoglobin G), Serum: 1108 mg/dL (ref 603–1613)
IgM (Immunoglobulin M), Srm: 172 mg/dL (ref 20–172)
Total Protein: 7 g/dL (ref 6.0–8.5)

## 2022-04-02 LAB — ANCA PROFILE
Anti-MPO Antibodies: <0.2 units (ref 0.0–0.9)
Anti-PR3 Antibodies: <0.2 units (ref 0.0–0.9)
Atypical pANCA: <1:20 {titer}
C-ANCA: <1:20 {titer}
P-ANCA: <1:20 {titer}

## 2022-04-02 LAB — HEMOGLOBIN A1C
Est. average glucose Bld gHb Est-mCnc: 97 mg/dL
Hgb A1c MFr Bld: 5 % (ref 4.8–5.6)

## 2022-04-02 LAB — VITAMIN B6: Vitamin B6: 23.8 ug/L (ref 3.4–65.2)

## 2022-04-02 LAB — URIC ACID: Uric Acid: 6.4 mg/dL (ref 3.8–8.4)

## 2022-04-02 LAB — ANA W/REFLEX: ANA Titer 1: NEGATIVE

## 2022-04-02 LAB — SJOGREN'S SYNDROME ANTIBODS(SSA + SSB)
ENA SSA (RO) Ab: <0.2 AI (ref 0.0–0.9)
ENA SSB (LA) Ab: <0.2 AI (ref 0.0–0.9)

## 2022-04-02 LAB — VITAMIN B1: Thiamine: 118.8 nmol/L (ref 66.5–200.0)

## 2022-04-02 LAB — TSH: TSH: 0.438 u[IU]/mL — ABNORMAL LOW (ref 0.450–4.500)

## 2022-04-02 LAB — VITAMIN B12: Vitamin B-12: 1420 pg/mL — ABNORMAL HIGH (ref 232–1245)

## 2022-04-10 ENCOUNTER — Ambulatory Visit: Payer: Commercial Managed Care - PPO | Admitting: Adult Health

## 2022-04-22 ENCOUNTER — Telehealth: Payer: Self-pay

## 2022-04-22 NOTE — Telephone Encounter (Signed)
Pt called back I discussed his results with he understood and thanked for the call.

## 2022-04-22 NOTE — Telephone Encounter (Signed)
I called patient to discuss. No answer, left a message asking him to call us back.  

## 2022-04-22 NOTE — Telephone Encounter (Signed)
-----   Message from Suanne Marker, MD sent at 04/22/2022 10:42 AM EDT ----- Unremarkable labs except borderline low TSH. May consider follow up and repeat thyroid labs with PCP. Continue current plan.  -VRP

## 2022-04-30 ENCOUNTER — Encounter: Payer: Self-pay | Admitting: Diagnostic Neuroimaging

## 2022-05-01 ENCOUNTER — Telehealth: Payer: Self-pay | Admitting: Diagnostic Neuroimaging

## 2022-05-01 ENCOUNTER — Telehealth: Payer: Self-pay | Admitting: Neurology

## 2022-05-01 ENCOUNTER — Other Ambulatory Visit: Payer: Self-pay | Admitting: *Deleted

## 2022-05-01 DIAGNOSIS — R202 Paresthesia of skin: Secondary | ICD-10-CM

## 2022-05-01 DIAGNOSIS — R200 Anesthesia of skin: Secondary | ICD-10-CM

## 2022-05-01 NOTE — Telephone Encounter (Signed)
Orders Placed This Encounter  Procedures   NCV with EMG(electromyography)     Please let patient know his schedule

## 2022-05-01 NOTE — Telephone Encounter (Signed)
Referral for Rheumatology sent to [facility], phone: [phone]

## 2022-05-02 NOTE — Telephone Encounter (Signed)
Pt scheduled for NCV/EMG on 11/10 at 8:30am

## 2022-05-09 ENCOUNTER — Encounter: Payer: Self-pay | Admitting: Diagnostic Neuroimaging

## 2022-05-13 NOTE — Telephone Encounter (Signed)
Pt wanting to know if there's is anything that can be done in the meantime, until NCS in Nov, to help nerve pain subside?

## 2022-05-15 MED ORDER — DULOXETINE HCL 30 MG PO CPEP: 30.0000 mg | ORAL_CAPSULE | Freq: Every day | ORAL | 6 refills | Status: DC

## 2022-05-15 NOTE — Telephone Encounter (Signed)
Meds ordered this encounter  Medications   DULoxetine (CYMBALTA) 30 MG capsule    Sig: Take 1 capsule (30 mg total) by mouth daily.    Dispense:  30 capsule    Refill:  6    Suanne Marker, MD 05/15/2022, 4:36 PM Certified in Neurology, Neurophysiology and Neuroimaging  [facility] [address], [address] [place], [place] [address] [phone]

## 2022-05-16 ENCOUNTER — Encounter: Payer: Self-pay | Admitting: Gastroenterology

## 2022-05-16 MED ORDER — PREDNISONE 10 MG PO TABS: ORAL_TABLET | ORAL | 0 refills | Status: AC

## 2022-05-16 NOTE — Telephone Encounter (Signed)
Prednisone 10 mg tabs:  Sig: 30 mg daily for 4 days, 20 mg daily for 4 days, then stop. Disp: 20 tabs, RF zero  - HD

## 2022-05-16 NOTE — Telephone Encounter (Signed)
I have openings in my afternoon office session next Wednesday, September 20.  Please contact him and give him an appointment to see me there.  -HD

## 2022-05-22 ENCOUNTER — Encounter: Payer: Self-pay | Admitting: Gastroenterology

## 2022-05-22 ENCOUNTER — Other Ambulatory Visit: Payer: Commercial Managed Care - PPO

## 2022-05-22 ENCOUNTER — Ambulatory Visit: Payer: Commercial Managed Care - PPO | Admitting: Gastroenterology

## 2022-05-22 VITALS — BP 122/78 | HR 95 | Ht 72.0 in | Wt 165.0 lb

## 2022-05-22 DIAGNOSIS — R198 Other specified symptoms and signs involving the digestive system and abdomen: Secondary | ICD-10-CM | POA: Diagnosis not present

## 2022-05-22 DIAGNOSIS — K5130 Ulcerative (chronic) rectosigmoiditis without complications: Secondary | ICD-10-CM | POA: Diagnosis not present

## 2022-05-22 MED ORDER — NA SULFATE-K SULFATE-MG SULF 17.5-3.13-1.6 GM/177ML PO SOLN: 1.0000 | Freq: Once | ORAL | 0 refills | Status: AC

## 2022-05-22 NOTE — Progress Notes (Signed)
[facility] Progress Note  Chief Complaint: Ulcerative proctosigmoiditis  Subjective  History: Matthew Camacho was seen today regarding recent increase in his colitis symptoms.  I last saw him in the office January of this year, he was generally doing well, but had had a brief flare of symptoms after COVID infection within 2 months prior to that. Contacted our office last week with an increase in diarrhea, recurrence of rectal bleeding and passage of mucus stools.  Those symptoms were persisting despite his continuation of Apriso 4 capsules a day and mesalamine suppository nightly. He was given a 1 week course of prednisone in the office appointment for today.  He had also previously messaged me about some sporadic areas of numbness hands, legs, arms, face as well as some burning discomfort around various joints.  His message indicated he had had MRI of the brain and cervical spine normal.  He wondered if the symptoms may be colitis related.  Dr. Leta Baptist of neurology most recently recommended a trial of duloxetine. _________________________  Matthew Camacho feels that the short course of prednisone has decreased the urgency and mucus.  He did not have bleeding with this recent flare, but says other flares over the course of this year have sometimes included bleeding and he has taken the mesalamine suppository for a week or 2 to settle him down.  ROS: Cardiovascular:  no chest pain Respiratory: no dyspnea He has numbness and tingling in various locations on the forearms and lower legs.  He just started duloxetine, so it is difficult to tell yet if it is helping. The patient's Past Medical, Family and Social History were reviewed and are on file in the EMR. Past Medical History:  Diagnosis Date   Colitis    Vitiligo     Objective:  Med list reviewed  Current Outpatient Medications:    Na Sulfate-K Sulfate-Mg Sulf 17.5-3.13-1.6 GM/177ML SOLN, Take 1 kit by mouth once for 1 dose., Disp: 354 mL,  Rfl: 0   DULoxetine (CYMBALTA) 30 MG capsule, Take 1 capsule (30 mg total) by mouth daily., Disp: 30 capsule, Rfl: 6   mesalamine (APRISO) 0.375 g 24 hr capsule, Take 2 capsules (0.75 g total) by mouth 2 (two) times daily., Disp: 360 capsule, Rfl: 3   mesalamine (CANASA) 1000 MG suppository, Place 1 suppository (1,000 mg total) rectally at bedtime., Disp: 90 suppository, Rfl: 3   predniSONE (DELTASONE) 10 MG tablet, Take 3 tablets (30 mg total) by mouth daily with breakfast for 4 days, THEN 2 tablets (20 mg total) daily with breakfast for 4 days. Then stop., Disp: 20 tablet, Rfl: 0   Vital signs in last 24 hrs: Vitals:   05/22/22 1337  BP: 122/78  Pulse: 95  SpO2: 98%   Wt Readings from Last 3 Encounters:  05/22/22 165 lb (74.8 kg)  03/26/22 164 lb 3.2 oz (74.5 kg)  02/13/22 162 lb 12.8 oz (73.8 kg)    Physical Exam  Well-appearing, thin as before. HEENT: sclera anicteric, oral mucosa moist without lesions Neck: supple, no thyromegaly, JVD or lymphadenopathy Cardiac: Regular without murmur,  no peripheral edema Pulm: clear to auscultation bilaterally, normal RR and effort noted Abdomen: soft, no tenderness, with active bowel sounds. No guarding or palpable hepatosplenomegaly. Skin; warm and dry, no jaundice or rash  Labs:     Latest Ref Rng & Units 02/05/2022   11:36 AM 05/29/2021    3:33 PM 08/22/2020   10:08 AM  CBC  WBC 4.0 - 10.5 K/uL 8.8  7.5  5.3   Hemoglobin 13.0 - 17.0 g/dL 15.4  14.7  15.1   Hematocrit 39.0 - 52.0 % 45.4  42.5  42.3   Platelets 150 - 400 K/uL 251  240.0  233.0       Latest Ref Rng & Units 03/26/2022    1:36 PM 02/05/2022   11:36 AM 05/29/2021    3:33 PM  CMP  Glucose 70 - 99 mg/dL  126  92   BUN 6 - 20 mg/dL  17  16   Creatinine 0.61 - 1.24 mg/dL  0.98  0.98   Sodium 135 - 145 mmol/L  140  139   Potassium 3.5 - 5.1 mmol/L  4.0  4.0   Chloride 98 - 111 mmol/L  104  102   CO2 22 - 32 mmol/L  25  30   Calcium 8.9 - 10.3 mg/dL  9.7  9.6   Total  Protein 6.0 - 8.5 g/dL 7.0  7.7  7.6   Total Bilirubin 0.3 - 1.2 mg/dL  1.7  0.6   Alkaline Phos 38 - 126 U/L  64  74   AST 15 - 41 U/L  21  15   ALT 0 - 44 U/L  20  14     ___________________________________________ Radiologic studies:   ____________________________________________ Other:   _____________________________________________ Assessment & Plan  Assessment: Encounter Diagnoses  Name Primary?   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  It is difficult to tell if all of these "flares" have been increased activity of the colitis or if there is also some IBS overlay.  The fact that there has intermittently been small-volume bleeding with improvement in symptoms when the takes additional suppository or recently prednisone implies it is probably increased colitis activity. It occurs to me that he has not been screened for celiac at least while I have been seeing him.  We discussed the need to assess his colitis activity to determine whether his current therapy is sufficient.  I did not do any inflammatory markers today because he is just finishing prednisone. Plan: Celiac Ab Colonoscopy.  He was agreeable after discussion of procedure and risks.  The benefits and risks of the planned procedure were described in detail with the patient or (when appropriate) their health care proxy.  Risks were outlined as including, but not limited to, bleeding, infection, perforation, adverse medication reaction leading to cardiac or pulmonary decompensation, pancreatitis (if ERCP).  The limitation of incomplete mucosal visualization was also discussed.  No guarantees or warranties were given.  The benefits and risks of the planned procedure were described in detail with the patient or (when appropriate) their health care proxy.  Risks were outlined as including, but not limited to, bleeding,  infection, perforation, adverse medication reaction leading to cardiac or pulmonary decompensation, pancreatitis (if ERCP).  The limitation of incomplete mucosal visualization was also discussed.  No guarantees or warranties were given.  He and his wife have been trying to get pregnant for about the last 3 months.  They tried for about a month with their first child, so he did not seem to have any concerns about decreased sperm count related to mesalamine (which we had discussed at the time).,  However he is wondering what he should do that at this point.  I decided to leave that to his discretion, but if he would like to stop the oral mesalamine,  then the colitis probably will not develop a severe flare.  It would probably be best to wait until we do the colonoscopy so we can see the disease activity while on mesalamine, and he was agreeable to that.    Nelida Meuse III

## 2022-05-22 NOTE — Patient Instructions (Signed)
_______________________________________________________  If you are age 30 or older, your body mass index should be between 23-30. Your Body mass index is 22.38 kg/m. If this is out of the aforementioned range listed, please consider follow up with your Primary Care Provider.  If you are age 51 or younger, your body mass index should be between 19-25. Your Body mass index is 22.38 kg/m. If this is out of the aformentioned range listed, please consider follow up with your Primary Care Provider.   ________________________________________________________  The [facility] providers would like to encourage you to use [facility] to communicate with providers for non-urgent requests or questions.  Due to long hold times on the telephone, sending your provider a message by [facility] may be a faster and more efficient way to get a response.  Please allow 48 business hours for a response.  Please remember that this is for non-urgent requests.  _______________________________________________________  Matthew Camacho have been scheduled for a colonoscopy. Please follow written instructions given to you at your visit today.  Please pick up your prep supplies at the pharmacy within the next 1-3 days. If you use inhalers (even only as needed), please bring them with you on the day of your procedure.  Your provider has requested that you go to the basement level for lab work before leaving today. Press "B" on the elevator. The lab is located at the first door on the left as you exit the elevator.  Due to recent changes in healthcare laws, you may see the results of your imaging and laboratory studies on MyChart before your provider has had a chance to review them.  We understand that in some cases there may be results that are confusing or concerning to you. Not all laboratory results come back in the same time frame and the provider may be waiting for multiple results in order to interpret others.  Please give [place] 48 hours in  order for your provider to thoroughly review all the results before contacting the office for clarification of your results.    It was a pleasure to see you today!  Thank you for trusting me with your gastrointestinal care!

## 2022-05-23 LAB — IGA: Immunoglobulin A: 313 mg/dL — ABNORMAL HIGH (ref 47–310)

## 2022-05-23 LAB — TISSUE TRANSGLUTAMINASE, IGA: (tTG) Ab, IgA: <1 U/mL

## 2022-05-24 ENCOUNTER — Encounter: Payer: Self-pay | Admitting: Diagnostic Neuroimaging

## 2022-05-27 ENCOUNTER — Encounter: Payer: Self-pay | Admitting: Student

## 2022-05-30 NOTE — Telephone Encounter (Signed)
Pt rescheduled for NCV/EMG with Dr. Krista Blue for 10/25 at 2:30pm

## 2022-06-05 NOTE — Telephone Encounter (Signed)
Please fax over his labs to [facility] they need them to schedule. Fax [phone]

## 2022-06-10 ENCOUNTER — Encounter: Payer: Self-pay | Admitting: Gastroenterology

## 2022-06-12 ENCOUNTER — Ambulatory Visit: Payer: Commercial Managed Care - PPO | Admitting: Gastroenterology

## 2022-06-18 ENCOUNTER — Encounter: Payer: Self-pay | Admitting: Gastroenterology

## 2022-06-18 ENCOUNTER — Ambulatory Visit (AMBULATORY_SURGERY_CENTER): Payer: Commercial Managed Care - PPO | Admitting: Gastroenterology

## 2022-06-18 VITALS — BP 102/59 | HR 67 | Temp 98.2°F | Resp 13 | Ht 72.0 in | Wt 165.0 lb

## 2022-06-18 DIAGNOSIS — K5130 Ulcerative (chronic) rectosigmoiditis without complications: Secondary | ICD-10-CM

## 2022-06-18 DIAGNOSIS — K529 Noninfective gastroenteritis and colitis, unspecified: Secondary | ICD-10-CM | POA: Diagnosis not present

## 2022-06-18 MED ORDER — SODIUM CHLORIDE 0.9 % IV SOLN: 500.0000 mL | Freq: Once | INTRAVENOUS | Status: DC

## 2022-06-18 MED ORDER — HYOSCYAMINE SULFATE 0.125 MG SL SUBL: 0.1250 mg | SUBLINGUAL_TABLET | Freq: Four times a day (QID) | SUBLINGUAL | 1 refills | Status: DC | PRN

## 2022-06-18 NOTE — Progress Notes (Signed)
Called to room to assist during endoscopic procedure.  Patient ID and intended procedure confirmed with present staff. Received instructions for my participation in the procedure from the performing physician.  

## 2022-06-18 NOTE — Progress Notes (Signed)
To pacu, VSS. Report to [email or web address] 

## 2022-06-18 NOTE — Op Note (Signed)
[facility] Patient Name: Matthew HammansBradley Kooyman Procedure Date: 06/18/2022 10:09 AM MRN: [id number] Endoscopist: Sherilyn CooterHenry L. 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 symptoms persists with                            regular use of suppository.                           recent 8-10 day course of prednisone finished over                            2 weeks ago. Medicines:                Monitored Anesthesia Care Procedure:                Pre-Anesthesia Assessment:                           - Prior to the procedure, a History and Physical                            was performed, and patient medications and                            allergies were reviewed. The patient's tolerance of                            previous anesthesia was also reviewed. The risks                            and benefits of the procedure and the sedation                            options and risks were discussed with the patient.                            All questions were  answered, and informed consent                            was obtained. Prior Anticoagulants: The patient has                            taken no previous anticoagulant or antiplatelet                            agents. ASA Grade Assessment: II - A patient with                            mild systemic disease. After reviewing the risks                            and benefits, the patient was deemed in                            satisfactory condition to undergo the  procedure.                           After obtaining informed consent, the colonoscope                            was passed under direct vision. Throughout the                            procedure, the patient's blood pressure, pulse, and                            oxygen saturations were monitored continuously. The                            Olympus CF-HQ190L (Serial# [id number]) Colonoscope was                            introduced through the anus and advanced to the the                            terminal ileum, with identification of the                            appendiceal orifice and IC valve. The colonoscopy                            was somewhat difficult due to a redundant colon.                            Successful completion of the procedure was aided by                            straightening and shortening the scope to obtain  bowel loop reduction. The patient tolerated the                            procedure well. The quality of the bowel                            preparation was excellent. The terminal ileum,                            ileocecal valve, appendiceal orifice, and rectum                            were photographed. Scope In: 10:19:12 AM Scope Out: 10:35:10 AM Scope Withdrawal Time: 0 hours 10 minutes 45 seconds  Total Procedure Duration: 0 hours 15 minutes 58 seconds  Findings:                 The terminal ileum appeared normal.                           Normal mucosa was found from cecum to distal rectum                            rectum. Several biopsies were obtained in the                            sigmoid colon (Jar 2) and in the ascending colon                            (Jar 1) with cold forceps for histology. (assess                            microscopic disease activity)                           Localized mild inflammation characterized by                            erythema and granularity was found in the distal                             rectum (not circumferential). Biopsies were taken                            with a cold forceps for histology. (Jar 3)                           The exam was otherwise without abnormality on                            direct and retroflexion views. Complications:            No immediate complications. Estimated Blood Loss:     Estimated blood loss was minimal. Impression:               - The examined  portion of the ileum was normal.                           - Normal mucosa from cecum to rectum.                           - Localized mild inflammation was found in the                            distal rectum secondary to proctitis ulcerative                            colitis. Biopsied.                           - The examination was otherwise normal on direct                            and retroflexion views.                           - Several biopsies were obtained in the sigmoid                            colon and in the ascending colon.                           Reassuring that the disease activity appears                            minimal at present. Recommendation:           - Patient has a contact number available for                            emergencies. The signs and symptoms of potential                            delayed complications were discussed with the                            patient. Return to normal activities tomorrow.                            Written discharge instructions were provided to the                            patient.                           - Resume previous diet.                           - Continue present medications. Asacol at current                            dose  and use mesalamine suppository nightly.                           - Await pathology results. Then determine any                            medication changes (perhaps mesalamine in another                            PR form)                           -  hyoscyamine 0.125 mg, one tabet Q 6 hours as                            needed for rectal mucus and urgency. Disp#45, RF 1 Adriell Polansky L. Loletha Carrow, MD 06/18/2022 10:53:46 AM This report has been signed electronically.

## 2022-06-18 NOTE — Patient Instructions (Addendum)
Resume previous diet and continue present medications - Asacol at current dose and use mesalamine suppository nightly Await pathology results - then determine any medication changes (perhaps mesalamine in another PR form) Use Hyoscyamine 0.125 mg one tablet every 6 hours as needed for rectal mucus and urgency - [employer]     YOU HAD AN ENDOSCOPIC PROCEDURE TODAY AT [facility]:   Refer to the procedure report that was given to you for any specific questions about what was found during the examination.  If the procedure report does not answer your questions, please call your gastroenterologist to clarify.  If you requested that your care partner not be given the details of your procedure findings, then the procedure report has been included in a sealed envelope for you to review at your convenience later.  YOU SHOULD EXPECT: Some feelings of bloating in the abdomen. Passage of more gas than usual.  Walking can help get rid of the air that was put into your GI tract during the procedure and reduce the bloating. If you had a lower endoscopy (such as a colonoscopy or flexible sigmoidoscopy) you may notice spotting of blood in your stool or on the toilet paper. If you underwent a bowel prep for your procedure, you may not have a normal bowel movement for a few days.  Please Note:  You might notice some irritation and congestion in your nose or some drainage.  This is from the oxygen used during your procedure.  There is no need for concern and it should clear up in a day or so.  SYMPTOMS TO REPORT IMMEDIATELY:  Following lower endoscopy (colonoscopy or flexible sigmoidoscopy):  Excessive amounts of blood in the stool  Significant tenderness or worsening of abdominal pains  Swelling of the abdomen that is new, acute  Fever of 100F or higher  For urgent or emergent issues, a gastroenterologist can be reached at any hour by calling [phone]. Do not use MyChart messaging for  urgent concerns.    DIET:  We do recommend a small meal at first, but then you may proceed to your regular diet.  Drink plenty of fluids but you should avoid alcoholic beverages for 24 hours.  ACTIVITY:  You should plan to take it easy for the rest of today and you should NOT DRIVE or use heavy machinery until tomorrow (because of the sedation medicines used during the test).    FOLLOW UP: Our staff will call the number listed on your records the next business day following your procedure.  We will call around 7:15- 8:00 am to check on you and address any questions or concerns that you may have regarding the information given to you following your procedure. If we do not reach you, we will leave a message.     If any biopsies were taken you will be contacted by phone or by letter within the next 1-3 weeks.  Please call us at [phone] if you have not heard about the biopsies in 3 weeks.    SIGNATURES/CONFIDENTIALITY: You and/or your care partner have signed paperwork which will be entered into your electronic medical record.  These signatures attest to the fact that that the information above on your After Visit Summary has been reviewed and is understood.  Full responsibility of the confidentiality of this discharge information lies with you and/or your care-partner.

## 2022-06-18 NOTE — Progress Notes (Signed)
Vitals-DT  Pt's states no medical or surgical changes since previsit or office visit.  

## 2022-06-18 NOTE — Progress Notes (Signed)
No changes to clinical history since GI office visit on 05/22/22.  The patient is appropriate for an endoscopic procedure in the ambulatory setting.  - Kimberlye Dilger Danis, MD    

## 2022-06-19 ENCOUNTER — Telehealth: Payer: Self-pay

## 2022-06-19 NOTE — Telephone Encounter (Signed)
  Follow up Call-     06/18/2022    9:28 AM 06/18/2022    9:24 AM  Call back number  Post procedure Call Back phone  # [phone]   Permission to leave phone message  Yes     Patient questions:  Do you have a fever, pain , or abdominal swelling? No. Pain Score  0 *  Have you tolerated food without any problems? Yes.    Have you been able to return to your normal activities? Yes.    Do you have any questions about your discharge instructions: Diet   No. Medications  No. Follow up visit  No.  Do you have questions or concerns about your Care? No.  Actions: * If pain score is 4 or above: No action needed, pain <4.

## 2022-06-26 ENCOUNTER — Ambulatory Visit: Payer: Commercial Managed Care - PPO | Admitting: Neurology

## 2022-06-26 VITALS — BP 141/81 | HR 100 | Ht 71.0 in | Wt 161.0 lb

## 2022-06-26 DIAGNOSIS — R202 Paresthesia of skin: Secondary | ICD-10-CM

## 2022-06-26 DIAGNOSIS — R200 Anesthesia of skin: Secondary | ICD-10-CM

## 2022-06-26 MED ORDER — DULOXETINE HCL 60 MG PO CPEP: 60.0000 mg | ORAL_CAPSULE | Freq: Every day | ORAL | 11 refills | Status: DC

## 2022-06-26 NOTE — Progress Notes (Signed)
Chief Complaint  Patient presents with   Procedure    Rm EMG/NCV 4.       ASSESSMENT AND PLAN  Matthew Camacho is a 31 y.o. male   Intermittent bilateral upper and lower extremity paresthesia  Normal EMG nerve conduction study, no evidence of large fiber peripheral neuropathy.  Symptoms has improved with low-dose Cymbalta 30 mg daily, tolerating it well, will increase to 60 mg every day,  Laboratory evaluation showed mild abnormal TSH, repeat laboratory evaluation, also add on inflammatory markers  DIAGNOSTIC DATA (LABS, IMAGING, TESTING) - I reviewed patient records, labs, notes, testing and imaging myself where available.   MEDICAL HISTORY:  Matthew Camacho is a 31 year old right-handed male, EMG nerve conduction study by request of Dr Leta Baptist for evaluation of intermittent paresthesia  I reviewed and summarized the referring note. History ulcerative colitis, occasionally flareup, treated with as needed mesalamine Vitiligo  He worked at [employer] job, workout regularly, including weightlifting without any difficulty, around June 2023, he noticed intermittent paresthesia starting at the left anterior shin, then began to involve left hand intermittently but close no limitation in his daily activity including work-up  Patient was started on Cymbalta 30 mg since September 2023 tolerating it well, was not sure about the benefit  Personally reviewed MRI of the brain with and without contrast June 2023, normal  MRI of cervical spine with without contrast showed no significant abnormality  Extensive laboratory evaluations showed normal or negative B12, CBC, CMP with exception of slight elevation of total albumin 1.7, mild elevated glucose 126, normal A1c, protein electrophoresis, ANA, SSA AB antibody, uric acid, vitamin B1, B6, ANCA profile, tissue transglutaminase  Mildly decreased TSH 0.438  EMG nerve conduction study was normal.   PHYSICAL EXAM:   Vitals:   06/26/22 1411   BP: (!) 141/81  Pulse: 100  Weight: 161 lb (73 kg)  Height: 5\' 11"  (1.803 m)   Body mass index is 22.45 kg/m.  PHYSICAL EXAMNIATION:  Gen: NAD, conversant, well nourised, well groomed                     Cardiovascular: Regular rate rhythm, no peripheral edema, warm, nontender. Eyes: Conjunctivae clear without exudates or hemorrhage Neck: Supple, no carotid bruits. Pulmonary: Clear to auscultation bilaterally   NEUROLOGICAL EXAM:  MENTAL STATUS: Speech/cognition: Awake, alert, oriented to history taking and casual conversation CRANIAL NERVES: CN II: Visual fields are full to confrontation. Pupils are round equal and briskly reactive to light. CN III, IV, VI: extraocular movement are normal. No ptosis. CN V: Facial sensation is intact to light touch CN VII: Face is symmetric with normal eye closure  CN VIII: Hearing is normal to causal conversation. CN IX, X: Phonation is normal. CN XI: Head turning and shoulder shrug are intact  MOTOR: There is no pronator drift of out-stretched arms. Muscle bulk and tone are normal. Muscle strength is normal.  REFLEXES: Reflexes are 2+ and symmetric at the biceps, triceps, knees, and ankles. Plantar responses are flexor.  SENSORY: Intact to light touch, pinprick and vibratory sensation are intact in fingers and toes.  COORDINATION: There is no trunk or limb dysmetria noted.  GAIT/STANCE: Posture is normal. Gait is steady with normal steps, base, arm swing, and turning. Heel and toe walking are normal. Tandem gait is normal.  Romberg is absent.  REVIEW OF SYSTEMS:  Full 14 system review of systems performed and notable only for as above All other review of systems were negative.  ALLERGIES: No Known Allergies  HOME MEDICATIONS: Current Outpatient Medications  Medication Sig Dispense Refill   DULoxetine (CYMBALTA) 30 MG capsule Take 1 capsule (30 mg total) by mouth daily. 30 capsule 6   hyoscyamine (LEVSIN SL) 0.125 MG SL  tablet Place 1 tablet (0.125 mg total) under the tongue every 6 (six) hours as needed. 45 tablet 1   mesalamine (APRISO) 0.375 g 24 hr capsule Take 2 capsules (0.75 g total) by mouth 2 (two) times daily. 360 capsule 3   mesalamine (CANASA) 1000 MG suppository Place 1 suppository (1,000 mg total) rectally at bedtime. 90 suppository 3   No current facility-administered medications for this visit.    PAST MEDICAL HISTORY: Past Medical History:  Diagnosis Date   Colitis    Vitiligo     PAST SURGICAL HISTORY: Past Surgical History:  Procedure Laterality Date   HERNIA REPAIR      FAMILY HISTORY: Family History  Problem Relation Age of Onset   Diabetes Maternal Grandfather    Vitiligo Paternal Grandfather    Colon cancer Neg Hx    Esophageal cancer Neg Hx    Rectal cancer Neg Hx    Stomach cancer Neg Hx     SOCIAL HISTORY: Social History   Socioeconomic History   Marital status: Married    Spouse name: Dois Davenport   Number of children: 1   Years of education: Not on file   Highest education level: Not on file  Occupational History   Not on file  Tobacco Use   Smoking status: Never   Smokeless tobacco: Never  Vaping Use   Vaping Use: Never used  Substance and Sexual Activity   Alcohol use: Yes    Alcohol/week: 7.0 standard drinks of alcohol    Types: 7 Standard drinks or equivalent per week   Drug use: No   Sexual activity: Yes  Other Topics Concern   Not on file  Social History Narrative   Lives with wife   Right handed   Caffeine 2 cups per day    Social Determinants of Health   Financial Resource Strain: Not on file  Food Insecurity: Not on file  Transportation Needs: Not on file  Physical Activity: Not on file  Stress: Not on file  Social Connections: Not on file  Intimate Partner Violence: Not on file      Levert Feinstein, M.D. Ph.D.  [facility] [address] [place], [place] [address] Ph: [phone] Fax:  [phone]  CC:  Darral Dash, DO [address] [place],  [place] [address]  Darral Dash, DO

## 2022-06-27 LAB — THYROID PANEL WITH TSH
Free Thyroxine Index: 1.7 (ref 1.2–4.9)
T3 Uptake Ratio: 29 % (ref 24–39)
T4, Total: 5.7 ug/dL (ref 4.5–12.0)
TSH: 0.936 u[IU]/mL (ref 0.450–4.500)

## 2022-06-27 LAB — VITAMIN D 25 HYDROXY (VIT D DEFICIENCY, FRACTURES): Vit D, 25-Hydroxy: 15.4 ng/mL — ABNORMAL LOW (ref 30.0–100.0)

## 2022-06-27 LAB — CK: Total CK: 113 U/L (ref 49–439)

## 2022-06-28 NOTE — Procedures (Signed)
Full Name: Davidson Palmieri Gender: Male MRN #: [id number] Date of Birth: 07-31-1991    Visit Date: 06/26/2022 14:46 Age: 31 Years Examining Physician: Dr. Terrace Arabia Referring Physician: Dr. Marjory Lies Height: 5 feet 11 inch History: 31 year old male complains of intermittent upper and lower extremity paresthesia  Summary of the test:  Nerve conduction study:  Left sural, superficial peroneal sensory responses were normal.  Left median, ulnar sensory, motor and mixed responses were normal.  Left tibial, superficial peroneal motor responses were normal  Electromyography Selected needle examinations of left upper, lower extremity muscles, lumbar and paraspinal muscles were normal.   Conclusion: This is a normal study.  There is no electrodiagnostic evidence of large fiber peripheral neuropathy, left cervical or lumbosacral radiculopathy.    ------------------------------- Levert Feinstein M.D. Ph.D.  [facility] [address] [place], [place] [address] Tel: [phone] Fax: [phone]  Verbal informed consent was obtained from the patient, patient was informed of potential risk of procedure, including bruising, bleeding, hematoma formation, infection, muscle weakness, muscle pain, numbness, among others.        MNC    Nerve / Sites Muscle Latency Ref. Amplitude Ref. Rel Amp Segments Distance Velocity Ref. Area    ms ms mV mV %  cm m/s m/s mVms  L Median - APB     Wrist APB 3.7 ?4.4 10.5 ?4.0 100 Wrist - APB 7   50.1     Upper arm APB 7.6  10.4  98.7 Upper arm - Wrist 20 51 ?49 49.8  L Ulnar - ADM     Wrist ADM 3.4 ?3.3 10.9 ?6.0 100 Wrist - ADM 7   41.6     B.Elbow ADM 6.1  9.5  87.6 B.Elbow - Wrist 17 63 ?49 39.0     A.Elbow ADM 8.5  10.5  110 A.Elbow - B.Elbow 11 46 ?49 42.4  L Peroneal - EDB     Ankle EDB 4.1 ?6.5 7.3 ?2.0 100 Ankle - EDB 9   39.4     Fib head EDB 10.8  6.7  91.9 Fib head - Ankle 31 46 ?44 36.6     Pop fossa EDB 13.1  6.2   92.9 Pop fossa - Fib head 12 53 ?44 35.2         Pop fossa - Ankle      L Tibial - AH     Ankle AH 4.3 ?5.8 14.7 ?4.0 100 Ankle - AH 9   48.2     Pop fossa AH 15.1  11.4  77.9 Pop fossa - Ankle 37.5 35 ?41 42.6             SNC    Nerve / Sites Rec. Site Peak Lat Ref.  Amp Ref. Segments Distance Peak Diff Ref.    ms ms V V  cm ms ms  L Sural - Ankle (Calf)     Calf Ankle 4.0 ?4.4 21 ?6 Calf - Ankle 14    L Superficial peroneal - Ankle     Lat leg Ankle 3.5 ?4.4 23 ?6 Lat leg - Ankle 14    L Median, Ulnar - Transcarpal comparison     Median Palm Wrist 2.0 ?2.2 34 ?35 Median Palm - Wrist 8       Ulnar Palm Wrist 2.0 ?2.2 16 ?12 Ulnar Palm - Wrist 8          Median Palm - Ulnar Palm  -0.0 ?0.4  L  Median - Orthodromic (Dig II, Mid palm)     Dig II Wrist 3.1 ?3.4 19 ?10 Dig II - Wrist 13    L Ulnar - Orthodromic, (Dig V, Mid palm)     Dig V Wrist 2.8 ?3.1 11 ?5 Dig V - Wrist 29                 F  Wave    Nerve F Lat Ref.   ms ms  L Ulnar - ADM 28.9 ?32.0  R Ulnar - ADM 51.9 ?32.0  L Tibial - AH 53.3 ?56.0           EMG Summary Table    Spontaneous MUAP Recruitment  Muscle IA Fib PSW Fasc Other Amp Dur. Poly Pattern  L. Tibialis anterior Normal None None None _______ Normal Normal Normal Normal  L. Tibialis posterior Normal None None None _______ Normal Normal Normal Normal  L. Peroneus longus Normal None None None _______ Normal Normal Normal Normal  L. Gastrocnemius (Medial head) Normal None None None _______ Normal Normal Normal Normal  L. Vastus lateralis Normal None None None _______ Normal Normal Normal Normal  L. Lumbar paraspinals (low) Normal None None None _______ Normal Normal Normal Normal  L. Lumbar paraspinals (mid) Normal None None None _______ Normal Normal Normal Normal  L. First dorsal interosseous Normal None None None _______ Normal Normal Normal Normal  L. Biceps brachii Normal None None None _______ Normal Normal Normal Normal  L. Deltoid Normal None None  None _______ Normal Normal Normal Normal  L. Triceps brachii Normal None None None _______ Normal Normal Normal Normal  L. Cervical paraspinals Normal None None None _______ Normal Normal Normal Normal

## 2022-07-08 ENCOUNTER — Ambulatory Visit: Payer: Commercial Managed Care - PPO | Admitting: Gastroenterology

## 2022-07-12 ENCOUNTER — Encounter: Payer: Commercial Managed Care - PPO | Admitting: Neurology

## 2022-09-09 ENCOUNTER — Ambulatory Visit: Payer: Commercial Managed Care - PPO | Admitting: Diagnostic Neuroimaging

## 2022-09-12 ENCOUNTER — Other Ambulatory Visit: Payer: Self-pay | Admitting: Gastroenterology

## 2022-10-24 ENCOUNTER — Other Ambulatory Visit: Payer: Self-pay | Admitting: Gastroenterology

## 2023-02-10 ENCOUNTER — Encounter: Payer: Commercial Managed Care - PPO | Admitting: Family Medicine

## 2023-02-11 ENCOUNTER — Ambulatory Visit (INDEPENDENT_AMBULATORY_CARE_PROVIDER_SITE_OTHER): Payer: Commercial Managed Care - PPO | Admitting: Family Medicine

## 2023-02-11 ENCOUNTER — Encounter: Payer: Self-pay | Admitting: Family Medicine

## 2023-02-11 VITALS — BP 110/70 | HR 79 | Ht 71.0 in | Wt 173.0 lb

## 2023-02-11 DIAGNOSIS — E559 Vitamin D deficiency, unspecified: Secondary | ICD-10-CM

## 2023-02-11 DIAGNOSIS — Z0000 Encounter for general adult medical examination without abnormal findings: Secondary | ICD-10-CM

## 2023-02-11 NOTE — Assessment & Plan Note (Signed)
Doing well today without any acute concerns.  Chronic conditions of ulcerative colitis and vitiligo are stable. - Screening lipid panel today

## 2023-02-11 NOTE — Assessment & Plan Note (Signed)
Noted on previous labs.  He is currently taking 2000 international units daily. - Vitamin D level today

## 2023-02-11 NOTE — Patient Instructions (Signed)
Everything looks good today.  We are going to do a screening cholesterol check for you and we are also going to check on your vitamin D level.  If you have any other issues, let me know.  I will let you know if any of the tests are abnormal.

## 2023-02-11 NOTE — Progress Notes (Signed)
    SUBJECTIVE:   CHIEF COMPLAINT / HPI: Annual physical  Intermittent Paraesthesias - overall doing better - stable on Cymbalta 60 mg daily.  Weaker left arm and quad - they fatigue more easily than the other side - noted while working with PT. Has one more session with PT.   Ulcerative colitis - Had colonoscopy last year which was good, gets them as needed - Hasn't had major flare of UC since initial diagnoses   Vitamin D Deficiency - Diagnosed on previous labs - currently taking 2000 IU daily   Maternal grandparents with heart disease. No deaths before the age of 79.   Family is doing well overall. They have a 31 year old and are expecting a boy in September.    PERTINENT  PMH / PSH: Ulcerative colitis   OBJECTIVE:   BP 110/70   Pulse 79   Ht 5\' 11"  (1.803 m)   Wt 173 lb (78.5 kg)   SpO2 97%   BMI 24.13 kg/m   Gen: well-appearing, NAD CV: RRR, no m/r/g appreciated, no peripheral edema Pulm: CTAB, no wheezes/crackles GI: soft, non-tender, non-distended HEENT: TM clear bilaterally, no oropharyngeal erythema, no cervical LAD Skin: hypopigmented lesions consistent with vitiligo present  ASSESSMENT/PLAN:   Encounter for annual physical exam Doing well today without any acute concerns.  Chronic conditions of ulcerative colitis and vitiligo are stable. - Screening lipid panel today  Vitamin D deficiency Noted on previous labs.  He is currently taking 2000 international units daily. - Vitamin D level today    Evelena Leyden, DO [facility]

## 2023-02-12 LAB — LIPID PANEL
Chol/HDL Ratio: 2.8 ratio (ref 0.0–5.0)
Cholesterol, Total: 170 mg/dL (ref 100–199)
HDL: 61 mg/dL (ref >39)
LDL Chol Calc (NIH): 83 mg/dL (ref 0–99)
Triglycerides: 152 mg/dL — ABNORMAL HIGH (ref 0–149)
VLDL Cholesterol Cal: 26 mg/dL (ref 5–40)

## 2023-02-12 LAB — VITAMIN D 25 HYDROXY (VIT D DEFICIENCY, FRACTURES): Vit D, 25-Hydroxy: 30.1 ng/mL (ref 30.0–100.0)

## 2023-02-14 ENCOUNTER — Encounter: Payer: Commercial Managed Care - PPO | Admitting: Family Medicine

## 2023-07-01 ENCOUNTER — Other Ambulatory Visit: Payer: Self-pay | Admitting: Neurology

## 2023-07-02 ENCOUNTER — Encounter: Payer: Self-pay | Admitting: Student

## 2023-07-03 ENCOUNTER — Encounter: Payer: Self-pay | Admitting: Neurology

## 2023-07-03 ENCOUNTER — Ambulatory Visit: Payer: Commercial Managed Care - PPO | Admitting: Neurology

## 2023-07-03 ENCOUNTER — Telehealth: Payer: Self-pay | Admitting: Neurology

## 2023-07-03 VITALS — BP 128/80 | Ht 72.0 in | Wt 182.0 lb

## 2023-07-03 DIAGNOSIS — R202 Paresthesia of skin: Secondary | ICD-10-CM

## 2023-07-03 MED ORDER — DULOXETINE HCL 60 MG PO CPEP: 60.0000 mg | ORAL_CAPSULE | Freq: Every day | ORAL | 3 refills | Status: DC

## 2023-07-03 NOTE — Progress Notes (Signed)
Patient: Matthew Camacho Date of Birth: 09-07-1990  Reason for Visit: Follow up History from: Patient Primary Neurologist:    ASSESSMENT AND PLAN 32 y.o. year old male   1.  Intermittent bilateral upper and lower extremity paresthesia  -Symptoms currently under good control, would like to remain on Cymbalta 60 mg daily, also helpful for mood, will continue, we discussed slow wean off in the future if he desires -Normal EMG/NCV, MRI brain, cervical spine unrevealing  -On vitamin D supplementation -Follow-up with me in 1 year or sooner if needed  HISTORY  Matthew Camacho is a 32 year old right-handed male, EMG nerve conduction study by request of Dr Marjory Lies for evaluation of intermittent paresthesia   I reviewed and summarized the referring note. History ulcerative colitis, occasionally flareup, treated with as needed mesalamine Vitiligo   He worked at [employer] job, workout regularly, including weightlifting without any difficulty, around June 2023, he noticed intermittent paresthesia starting at the left anterior shin, then began to involve left hand intermittently but close no limitation in his daily activity including work-up   Patient was started on Cymbalta 30 mg since September 2023 tolerating it well, was not sure about the benefit   Personally reviewed MRI of the brain with and without contrast June 2023, normal   MRI of cervical spine with without contrast showed no significant abnormality   Extensive laboratory evaluations showed normal or negative B12, CBC, CMP with exception of slight elevation of total albumin 1.7, mild elevated glucose 126, normal A1c, protein electrophoresis, ANA, SSA AB antibody, uric acid, vitamin B1, B6, ANCA profile, tissue transglutaminase   Mildly decreased TSH 0.438   EMG nerve conduction study was normal.  Update July 03, 2023 SS: Vitamin D15.4, TSH 0.936, CK normal. Currently taking Cymbalta 60 mg daily. Feels balance feels weird a  times, if he drops something more on the left side. Left hand, ankle touch is altered. Unclear how much benefit 30 to 60 cymbalta made, could be mood booster/stopped thinking about it as much? On Vitamin D supplement. Had patella fracture right in Feb from skiing, recovered well.  Works full-time, works out, 62 week old, 32 year old.   REVIEW OF SYSTEMS: Out of a complete 14 system review of symptoms, the patient complains only of the following symptoms, and all other reviewed systems are negative.  See HPI  ALLERGIES: No Known Allergies  HOME MEDICATIONS: Outpatient Medications Prior to Visit  Medication Sig Dispense Refill   APRISO 0.375 g 24 hr capsule TAKE 2 CAPSULES (0.75 GRAMS)  TWICE A DAY 360 capsule 3   hyoscyamine (LEVSIN SL) 0.125 MG SL tablet Place 1 tablet (0.125 mg total) under the tongue every 6 (six) hours as needed. 45 tablet 1   mesalamine (CANASA) 1000 MG suppository USE 1 SUPPOSITORY RECTALLY AT BEDTIME 90 suppository 3   DULoxetine (CYMBALTA) 60 MG capsule Take 1 capsule (60 mg total) by mouth daily. 30 capsule 11   No facility-administered medications prior to visit.    PAST MEDICAL HISTORY: Past Medical History:  Diagnosis Date   Colitis    Vitiligo     PAST SURGICAL HISTORY: Past Surgical History:  Procedure Laterality Date   HERNIA REPAIR     KNEE SURGERY Right     FAMILY HISTORY: Family History  Problem Relation Age of Onset   Diabetes Maternal Grandfather    Vitiligo Paternal Grandfather    Colon cancer Neg Hx    Esophageal cancer Neg Hx    Rectal  cancer Neg Hx    Stomach cancer Neg Hx     SOCIAL HISTORY: Social History   Socioeconomic History   Marital status: Married    Spouse name: Dois Camacho   Number of children: 1   Years of education: Not on file   Highest education level: Not on file  Occupational History   Not on file  Tobacco Use   Smoking status: Never   Smokeless tobacco: Never  Vaping Use   Vaping status: Never Used   Substance and Sexual Activity   Alcohol use: Yes    Alcohol/week: 7.0 standard drinks of alcohol    Types: 7 Standard drinks or equivalent per week   Drug use: No   Sexual activity: Yes  Other Topics Concern   Not on file  Social History Narrative   Lives with wife   Right handed   Caffeine 2 cups per day    Social Determinants of Health   Financial Resource Strain: Not on file  Food Insecurity: Not on file  Transportation Needs: Not on file  Physical Activity: Not on file  Stress: Not on file  Social Connections: Unknown (01/08/2022)   Received from [facility]   Social Network    Social Network: Not on file  Intimate Partner Violence: Unknown (12/06/2021)   Received from [facility]   HITS    Physically Hurt: Not on file    Insult or Talk Down To: Not on file    Threaten Physical Harm: Not on file    Scream or Curse: Not on file   PHYSICAL EXAM  Vitals:   07/03/23 1338  BP: 128/80  Weight: 182 lb (82.6 kg)  Height: 6' (1.829 m)   Body mass index is 24.68 kg/m.  Generalized: Well developed, in no acute distress  Neurological examination  Mentation: Alert oriented to time, place, history taking. Follows all commands speech and language fluent Cranial nerve II-XII: Pupils were equal round reactive to light. Extraocular movements were full, visual field were full on confrontational test. Facial sensation and strength were normal. Head turning and shoulder shrug  were normal and symmetric. Motor: The motor testing reveals 5 over 5 strength of all 4 extremities. Good symmetric motor tone is noted throughout.  Sensory: Slight decreased soft touch sensation to left mid calf Coordination: Cerebellar testing reveals good finger-nose-finger and heel-to-shin bilaterally.  Gait and station: Gait is normal.  Reflexes: Deep tendon reflexes are symmetric and normal bilaterally.   DIAGNOSTIC DATA (LABS, IMAGING, TESTING) - I reviewed patient  records, labs, notes, testing and imaging myself where available.  Lab Results  Component Value Date   WBC 8.8 02/05/2022   HGB 15.4 02/05/2022   HCT 45.4 02/05/2022   MCV 88.5 02/05/2022   PLT 251 02/05/2022      Component Value Date/Time   NA 140 02/05/2022 1136   K 4.0 02/05/2022 1136   CL 104 02/05/2022 1136   CO2 25 02/05/2022 1136   GLUCOSE 126 (H) 02/05/2022 1136   BUN 17 02/05/2022 1136   CREATININE 0.98 02/05/2022 1136   CALCIUM 9.7 02/05/2022 1136   PROT 7.0 03/26/2022 1336   ALBUMIN 4.8 02/05/2022 1136   AST 21 02/05/2022 1136   ALT 20 02/05/2022 1136   ALKPHOS 64 02/05/2022 1136   BILITOT 1.7 (H) 02/05/2022 1136   GFRNONAA >60 02/05/2022 1136   GFRAA >60 02/16/2018 1926   Lab Results  Component Value Date   CHOL 170 02/11/2023   HDL  61 02/11/2023   LDLCALC 83 02/11/2023   TRIG 152 (H) 02/11/2023   CHOLHDL 2.8 02/11/2023   Lab Results  Component Value Date   HGBA1C 5.0 03/26/2022   Lab Results  Component Value Date   VITAMINB12 1,420 (H) 03/26/2022   Lab Results  Component Value Date   TSH 0.936 06/26/2022    Margie Ege, AGNP-C, DNP 07/03/2023, 2:00 PM [facility] [address], [address] [place], [place] [address] [phone]

## 2023-07-03 NOTE — Telephone Encounter (Signed)
Patient has appointment with Maralyn Sago today at 1:45.

## 2023-07-03 NOTE — Telephone Encounter (Signed)
Pt called needing an appt soon due to needing a medication refilled as soon as possible.

## 2023-07-03 NOTE — Patient Instructions (Signed)
Great to meet you today.  I will refill your Cymbalta.  If you wish to wean off in the future let me know.  I will see you back in 1 year.  Thanks!!  Meds ordered this encounter  Medications   DULoxetine (CYMBALTA) 60 MG capsule    Sig: Take 1 capsule (60 mg total) by mouth daily.    Dispense:  90 capsule    Refill:  3

## 2023-07-07 ENCOUNTER — Other Ambulatory Visit: Payer: Self-pay | Admitting: Student

## 2023-07-07 MED ORDER — DULOXETINE HCL 60 MG PO CPEP: 60.0000 mg | ORAL_CAPSULE | Freq: Every day | ORAL | 0 refills | Status: DC

## 2023-10-20 ENCOUNTER — Other Ambulatory Visit: Payer: Self-pay | Admitting: Gastroenterology

## 2023-12-04 ENCOUNTER — Other Ambulatory Visit: Payer: Self-pay

## 2023-12-04 ENCOUNTER — Encounter: Payer: Self-pay | Admitting: Gastroenterology

## 2023-12-04 ENCOUNTER — Other Ambulatory Visit: Payer: Self-pay | Admitting: Gastroenterology

## 2023-12-04 MED ORDER — MESALAMINE ER 0.375 G PO CP24: 0.7500 g | ORAL_CAPSULE | Freq: Two times a day (BID) | ORAL | 0 refills | Status: DC

## 2023-12-05 MED ORDER — MESALAMINE ER 0.375 G PO CP24: 750.0000 mg | ORAL_CAPSULE | Freq: Every day | ORAL | 0 refills | Status: DC

## 2023-12-24 ENCOUNTER — Ambulatory Visit: Payer: Commercial Managed Care - PPO | Admitting: Gastroenterology

## 2023-12-24 ENCOUNTER — Other Ambulatory Visit (INDEPENDENT_AMBULATORY_CARE_PROVIDER_SITE_OTHER)

## 2023-12-24 ENCOUNTER — Encounter: Payer: Self-pay | Admitting: Gastroenterology

## 2023-12-24 VITALS — BP 124/60 | HR 84 | Ht 72.0 in | Wt 182.0 lb

## 2023-12-24 DIAGNOSIS — K625 Hemorrhage of anus and rectum: Secondary | ICD-10-CM

## 2023-12-24 DIAGNOSIS — K5130 Ulcerative (chronic) rectosigmoiditis without complications: Secondary | ICD-10-CM

## 2023-12-24 LAB — COMPREHENSIVE METABOLIC PANEL WITH GFR
ALT: 13 U/L (ref 0–53)
AST: 15 U/L (ref 0–37)
Albumin: 4.7 g/dL (ref 3.5–5.2)
Alkaline Phosphatase: 79 U/L (ref 39–117)
BUN: 14 mg/dL (ref 6–23)
CO2: 30 meq/L (ref 19–32)
Calcium: 10.2 mg/dL (ref 8.4–10.5)
Chloride: 102 meq/L (ref 96–112)
Creatinine, Ser: 1.03 mg/dL (ref 0.40–1.50)
GFR: 96.86 mL/min (ref >60.00)
Glucose, Bld: 76 mg/dL (ref 70–99)
Potassium: 4.2 meq/L (ref 3.5–5.1)
Sodium: 140 meq/L (ref 135–145)
Total Bilirubin: 0.6 mg/dL (ref 0.2–1.2)
Total Protein: 7.4 g/dL (ref 6.0–8.3)

## 2023-12-24 LAB — CBC WITH DIFFERENTIAL/PLATELET
Basophils Absolute: 0 10*3/uL (ref 0.0–0.1)
Basophils Relative: 0.7 % (ref 0.0–3.0)
Eosinophils Absolute: 0.2 10*3/uL (ref 0.0–0.7)
Eosinophils Relative: 2.7 % (ref 0.0–5.0)
HCT: 43.2 % (ref 39.0–52.0)
Hemoglobin: 14.9 g/dL (ref 13.0–17.0)
Lymphocytes Relative: 22.9 % (ref 12.0–46.0)
Lymphs Abs: 1.6 10*3/uL (ref 0.7–4.0)
MCHC: 34.6 g/dL (ref 30.0–36.0)
MCV: 87 fl (ref 78.0–100.0)
Monocytes Absolute: 0.4 10*3/uL (ref 0.1–1.0)
Monocytes Relative: 5.7 % (ref 3.0–12.0)
Neutro Abs: 4.7 10*3/uL (ref 1.4–7.7)
Neutrophils Relative %: 68 % (ref 43.0–77.0)
Platelets: 251 10*3/uL (ref 150.0–400.0)
RBC: 4.96 Mil/uL (ref 4.22–5.81)
RDW: 12.9 % (ref 11.5–15.5)
WBC: 6.9 10*3/uL (ref 4.0–10.5)

## 2023-12-24 NOTE — Patient Instructions (Signed)
 Your provider has requested that you go to the basement level for lab work before leaving today. Press "B" on the elevator. The lab is located at the first door on the left as you exit the elevator.  Due to recent changes in healthcare laws, you may see the results of your imaging and laboratory studies on MyChart before your provider has had a chance to review them.  We understand that in some cases there may be results that are confusing or concerning to you. Not all laboratory results come back in the same time frame and the provider may be waiting for multiple results in order to interpret others.  Please give us  48 hours in order for your provider to thoroughly review all the results before contacting the office for clarification of your results.   Thank you for choosing me and [facility].  Dr.Henry Dominic Friendly

## 2023-12-24 NOTE — Progress Notes (Signed)
 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  Maintained on mesalamine  (Apriso ) and courses of mesalamine  suppository, first colonoscopy at this practice September 2020. Had some intermittent flares for a year or so leading up to October 2023 office visit, uncertain if all from colitis or perhaps some IBS overlay.  Symptoms were persisting despite consistent use of both oral and suppository mesalamine , had 10 to 14-day course of prednisone  prior to colonoscopy October 2023.  Mild distal rectal inflammation seen, exam otherwise normal.  Biopsies from more proximal parts of normal-appearing mucosa showed no microscopic activity.  There was activity with chronicity on distal rectal biopsies.  He opted to stay on mesalamine  suppositories alone at that point because he and his wife were trying to conceive and oral mesalamine  known to potentially decrease sperm count.   Subjective  History:  Matthew Camacho reports that his colitis is overall in good control on Apriso  4 capsules once daily. However, every 2 to 3 months he has to take a 2-week course of mesalamine  suppository because he has minor flares with more frequent stool urgency and some passage of bloody mucus.  He will occasionally have sharp left-sided abdominal pain that resolves after BM.  He had taken the hyoscyamine  periodically and thinks it may have helped, though difficult to say because those episodes of pain are typically brief. Denies any chronic upper digestive symptoms.  Appetite good and weight stable.  He also happily reports they had a second child since I last saw him and family is healthy.  He continues to work in Social worker. ROS: Cardiovascular:  no chest pain Respiratory: no dyspnea  The patient's Past Medical, Family and Social  History were reviewed and are on file in the EMR. Past Medical History:  Diagnosis Date   Colitis    Vitiligo     Past Surgical History:  Procedure Laterality Date   INGUINAL HERNIA REPAIR Left    KNEE SURGERY Right     Objective:  Med list reviewed  Current Outpatient Medications:    hyoscyamine  (LEVSIN SL) 0.125 MG SL tablet, Place 1 tablet (0.125 mg total) under the tongue every 6 (six) hours as needed., Disp: 45 tablet, Rfl: 1   mesalamine  (APRISO ) 0.375 g 24 hr capsule, Take 2 capsules (0.75 g total) by mouth daily., Disp: 180 capsule, Rfl: 0   mesalamine  (CANASA ) 1000 MG suppository, USE 1 SUPPOSITORY RECTALLY AT BEDTIME, Disp: 90 suppository, Rfl: 3  Takes vitamin D  and calcium supplement daily  Vital signs in last 24 hrs: Vitals:   12/24/23 1333  BP: 124/60  Pulse: 84   Wt Readings from Last 3 Encounters:  12/24/23 182 lb (82.6 kg)  07/03/23 182 lb (82.6 kg)  02/11/23 173 lb (78.5 kg)    Physical Exam  Looks well HEENT: sclera anicteric, oral mucosa moist without lesions Neck: supple, no thyromegaly, JVD or lymphadenopathy Cardiac: Regular without appreciable murmur,  no peripheral edema Pulm: clear to auscultation bilaterally, normal RR and effort noted Abdomen: soft, no tenderness, with active bowel sounds. No guarding or palpable hepatosplenomegaly. Skin; vitiligo on extensor surfaces arms and legs  Labs:  Last labs were a lipid panel and Vit D level after PCP visit June 2024 ___________________________________________ Radiologic studies:   ____________________________________________ Other:   _____________________________________________ Assessment & Plan  Assessment: Encounter Diagnoses  Name Primary?   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  I  recommend to use the mesalamine  suppository twice a week to keep this inflammation under control 2 not only feels better but also decreases his colorectal cancer risk. If that does not seem to work well he does not want to use the suppository, he will let me know and we could also try a change to a different oral mesalamine  preparation when the current Apriso  prescription runs out.  In that case I would probably use Asacol  HD 800 mg, 2 capsules twice daily or (depending on insurance coverage) Liada 3.6 g once daily  I also recommended a CBC as well as a CMP due to the rare risk of mesalamine  induced nephritis.  He was agreeable and went to the lab today.  He will otherwise see me in a year, but I encouraged him to keep in touch with me by portal message sooner than that.    Kerby Pearson III

## 2024-02-10 ENCOUNTER — Encounter: Payer: Self-pay | Admitting: *Deleted

## 2024-03-23 ENCOUNTER — Encounter: Payer: Self-pay | Admitting: Gastroenterology

## 2024-03-25 ENCOUNTER — Other Ambulatory Visit: Payer: Self-pay

## 2024-03-25 MED ORDER — MESALAMINE ER 0.375 G PO CP24: 0.7500 g | ORAL_CAPSULE | Freq: Two times a day (BID) | ORAL | 3 refills | Status: AC

## 2024-03-25 NOTE — Telephone Encounter (Signed)
 He is correct, it should be 360 capsules because he takes 4 capsules once daily.  Please correct that with the pharmacy and call them if necessary to clarify so he can have a sufficient supply this week.  VEAR Brand MD

## 2024-06-23 ENCOUNTER — Telehealth: Payer: Self-pay | Admitting: Gastroenterology

## 2024-06-23 DIAGNOSIS — R197 Diarrhea, unspecified: Secondary | ICD-10-CM

## 2024-06-23 DIAGNOSIS — K5130 Ulcerative (chronic) rectosigmoiditis without complications: Secondary | ICD-10-CM

## 2024-06-23 NOTE — Telephone Encounter (Signed)
 Spoke with pt. He reports that this most recent flair-up started about 2 weeks ago. Reports urgency, positive for blood and mucus. Usually has about 5 bms per day. Reports he has been taking the oral and suppository Mesalamine  as prescribed.

## 2024-06-23 NOTE — Telephone Encounter (Signed)
 Spoke with pt. He will come in ASAP to get the stool sample kits and complete them. Discussed using OTC Imodium while we are waiting for the results to come back. Pt verbalized understanding.

## 2024-06-23 NOTE — Telephone Encounter (Signed)
 Spoke with pt. He is having about 5 episodes per day of diarrhea. Will order C-Diff and fecal calprotectin levels. Pt reports that he is already taking 4 of the Apriso  per day and using the Canasa  at hs.

## 2024-06-23 NOTE — Telephone Encounter (Signed)
 Attempted to reach patient. No answer, left VM for patient to return call.

## 2024-06-23 NOTE — Telephone Encounter (Signed)
 Inbound call from patient stating that he has a flare up of colitis and would like to speak to either the nurse or Dr. Legrand. Please advise.

## 2024-06-24 MED ORDER — PREDNISONE 10 MG PO TABS: ORAL_TABLET | ORAL | 0 refills | Status: AC

## 2024-06-24 NOTE — Telephone Encounter (Signed)
 Spoke with pt. Discussed provider recommendations for steroid taper. Pt agrees to turn in C diff samples as soon as he returns to town. Verified pharmacy with pt. Will send Rx. Pt requested to f/u with Dr. Legrand. F/U appt scheduled for 08/17/24 at 1540.

## 2024-06-24 NOTE — Telephone Encounter (Signed)
 Called and spoke with pt. He reports that he is working about an hour away from [place] this week, and he hasn't been able to pick up and return the stool sample kit. He reports that his symptoms are increasing including more blood and mucus in his stool. He also reports he has noted more diarrhea during the day especially after eating, usually 2-3x after each meal. Pt would like to know if provider is able to order the steroid medication so he doesn't have to wait this out over the weekend. He reports that these symptoms are very similar to what he has experienced in the past.

## 2024-06-24 NOTE — Telephone Encounter (Signed)
 No worries, Jess, thanks for checking in.  He has not had prior C diff and this sounds like flares he has had periodically.  As long as he is willing to do the C diff testing when he returns to town, I think it is low risk to give him a short prednisone  course.  30 mg once daily for 5 days, 20 mg for 5 days, 10 mg for 5 days  He needs to also get back in with you or me to talk about a change in oral therapy b/c he seems to be breaking through the Apriso .   I put some thoughts about this in my last office note from April.  - HD

## 2024-06-24 NOTE — Addendum Note (Signed)
 Addended by: NICHOLAUS JARVIS on: 06/24/2024 05:09 PM   Modules accepted: Orders

## 2024-06-24 NOTE — Telephone Encounter (Signed)
 Inbound call from patient stating that he would like to know if he pick-up his stool kit today and turns it in on Friday, would his results come back Friday late morning early early afternoon in order for us  to possible set up a treatment plan for him due to his symptoms getting worse. Patient is requesting a call back from [place]. Please advise.

## 2024-06-25 ENCOUNTER — Other Ambulatory Visit

## 2024-06-25 DIAGNOSIS — K5130 Ulcerative (chronic) rectosigmoiditis without complications: Secondary | ICD-10-CM

## 2024-06-25 DIAGNOSIS — R197 Diarrhea, unspecified: Secondary | ICD-10-CM

## 2024-06-28 LAB — CLOSTRIDIUM DIFFICILE TOXIN B, QUALITATIVE, REAL-TIME PCR: Toxigenic C. Difficile by PCR: NOT DETECTED

## 2024-07-01 ENCOUNTER — Ambulatory Visit: Admitting: Gastroenterology

## 2024-07-01 ENCOUNTER — Telehealth: Payer: Self-pay | Admitting: Gastroenterology

## 2024-07-01 ENCOUNTER — Other Ambulatory Visit (INDEPENDENT_AMBULATORY_CARE_PROVIDER_SITE_OTHER)

## 2024-07-01 ENCOUNTER — Other Ambulatory Visit: Payer: Self-pay

## 2024-07-01 ENCOUNTER — Encounter: Payer: Self-pay | Admitting: Gastroenterology

## 2024-07-01 VITALS — BP 100/62 | HR 72 | Ht 72.0 in | Wt 167.0 lb

## 2024-07-01 DIAGNOSIS — K5130 Ulcerative (chronic) rectosigmoiditis without complications: Secondary | ICD-10-CM | POA: Diagnosis not present

## 2024-07-01 DIAGNOSIS — K51311 Ulcerative (chronic) rectosigmoiditis with rectal bleeding: Secondary | ICD-10-CM

## 2024-07-01 DIAGNOSIS — K625 Hemorrhage of anus and rectum: Secondary | ICD-10-CM

## 2024-07-01 DIAGNOSIS — R198 Other specified symptoms and signs involving the digestive system and abdomen: Secondary | ICD-10-CM

## 2024-07-01 LAB — CBC WITH DIFFERENTIAL/PLATELET
Basophils Absolute: 0 K/uL (ref 0.0–0.1)
Basophils Relative: 0.4 % (ref 0.0–3.0)
Eosinophils Absolute: 0 K/uL (ref 0.0–0.7)
Eosinophils Relative: 0 % (ref 0.0–5.0)
HCT: 40.9 % (ref 39.0–52.0)
Hemoglobin: 13.9 g/dL (ref 13.0–17.0)
Lymphocytes Relative: 26.1 % (ref 12.0–46.0)
Lymphs Abs: 3 K/uL (ref 0.7–4.0)
MCHC: 34.1 g/dL (ref 30.0–36.0)
MCV: 86.9 fl (ref 78.0–100.0)
Monocytes Absolute: 0.7 K/uL (ref 0.1–1.0)
Monocytes Relative: 6.4 % (ref 3.0–12.0)
Neutro Abs: 7.8 K/uL — ABNORMAL HIGH (ref 1.4–7.7)
Neutrophils Relative %: 67.1 % (ref 43.0–77.0)
Platelets: 412 K/uL — ABNORMAL HIGH (ref 150.0–400.0)
RBC: 4.7 Mil/uL (ref 4.22–5.81)
RDW: 13.1 % (ref 11.5–15.5)
WBC: 11.6 K/uL — ABNORMAL HIGH (ref 4.0–10.5)

## 2024-07-01 LAB — SEDIMENTATION RATE: Sed Rate: 10 mm/h (ref 0–15)

## 2024-07-01 MED ORDER — PREDNISONE 10 MG PO TABS: 40.0000 mg | ORAL_TABLET | Freq: Every day | ORAL | 0 refills | Status: DC

## 2024-07-01 NOTE — Telephone Encounter (Signed)
 Is he in the area such that he could come for a 4pm visit with me today?  - HD

## 2024-07-01 NOTE — Telephone Encounter (Signed)
 Inbound call from patient Stated that he was on a prednisone  taper and it seem like he has believed a flare up. Patient stated that he saw blood and mucus in his stool. Patient is requesting a call back to figure what he needs to do. Please advise.

## 2024-07-01 NOTE — Progress Notes (Signed)
 [facility] Progress Note  Chief Complaint: Ulcerative colitis flare  Summary of GI history:  Ulcerative proctosigmoiditis with occasional low-grade flares she with mesalamine  suppositories-see history in 12/24/23 office note  Subjective  HPI:  Matthew Camacho called our office last week with about 2 weeks of abdominal pain urgency for bowel movements and rectal bleeding.  He has had some intermittent flares every several months that he usually treats successfully with mesalamine  suppository, though it has not worked well on this occasion.  These are more severe symptoms than he has had in years despite taking the mesalamine  suppository and Apriso  daily.  When he called last week we put him on prednisone  30 mg daily for 5 days, then 20 mg daily for 5 days tapering off quickly.  Matthew Camacho reports having some improvement but not resolution of symptoms while on 30 mg, then once he dropped down to 20 mg 2 days ago he is back to where things have been prior to starting the course. No fever chills vomiting chest pain dysphagia or weight loss.  No recent antibiotic use, no foreign travel or camping.  He did submit a stool specimen for C. difficile PCR and calprotectin late last week.  C. difficile came back negative, calprotectin pending.  No history of C. difficile  ROS: Cardiovascular:  no chest pain Respiratory: no dyspnea No episodes of painful eye redness rash or mouth sores The patient's Past Medical, Family and Social History were reviewed and are on file in the EMR. Past Medical History:  Diagnosis Date   Colitis    Vitiligo     Past Surgical History:  Procedure Laterality Date   INGUINAL HERNIA REPAIR Left    KNEE SURGERY Right    VASECTOMY  06/2024    Objective:  Med list reviewed  Current Outpatient Medications:    hyoscyamine  (LEVSIN  SL) 0.125 MG SL tablet, Place 1 tablet (0.125 mg total) under the tongue every 6 (six) hours as needed., Disp: 45 tablet, Rfl: 1   mesalamine   (APRISO ) 0.375 g 24 hr capsule, Take 2 capsules (0.75 g total) by mouth in the morning and at bedtime., Disp: 360 capsule, Rfl: 3   mesalamine  (CANASA ) 1000 MG suppository, USE 1 SUPPOSITORY RECTALLY AT BEDTIME, Disp: 90 suppository, Rfl: 3   predniSONE  (DELTASONE ) 10 MG tablet, Take 3 tablets (30 mg total) by mouth daily with breakfast for 5 days, THEN 2 tablets (20 mg total) daily with breakfast for 5 days, THEN 1 tablet (10 mg total) daily with breakfast for 5 days., Disp: 30 tablet, Rfl: 0   diazepam (VALIUM) 10 MG tablet, SMARTSIG:1-2 Tablet(s) By Mouth (Patient not taking: Reported on 07/01/2024), Disp: , Rfl:    DULoxetine  (CYMBALTA ) 30 MG capsule, Take 1 capsule by mouth daily. (Patient not taking: Reported on 07/01/2024), Disp: , Rfl:    oxyCODONE (OXY IR/ROXICODONE) 5 MG immediate release tablet, Take 5 mg by mouth every 4 (four) hours as needed. (Patient not taking: Reported on 07/01/2024), Disp: , Rfl:    Vital signs in last 24 hrs: Vitals:   07/01/24 1555  BP: 100/62  Pulse: 72   Wt Readings from Last 3 Encounters:  07/01/24 167 lb (75.8 kg)  12/24/23 182 lb (82.6 kg)  07/03/23 182 lb (82.6 kg)    Physical Exam  Accompanied by his wife Well-appearing, pleasant as always HEENT: sclera anicteric, oral mucosa moist without lesions.  No conjunctival pallor Neck: supple, no thyromegaly, JVD or lymphadenopathy Cardiac: Regular without appreciable murmur,  no  peripheral edema Pulm: clear to auscultation bilaterally, normal RR and effort noted Abdomen: soft, no focal tenderness, with active bowel sounds. No guarding or palpable hepatosplenomegaly. Skin; warm and dry, no jaundice or rash  Labs:   ___________________________________________ Radiologic studies: Stool studies as noted above  ____________________________________________ Other:   _____________________________________________ Assessment & Plan  Assessment: Encounter Diagnoses  Name Primary?   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  This is despite continuing his oral Apriso  and mesalamine  suppository.  We discussed how his condition may still be confined to the rectosigmoid area, but perhaps it has evolved to be more extensive within the colon, or more severe or both.  He looks well, he is eating and drinking well and has no abdominal tenderness on exam, normal hemodynamics.  We should be able to get him over this flare with a higher dose and longer course of prednisone . 40 mg once daily for 7 days, 30 mg once daily for 7 days, 20 mg daily for 7 days, 10 mg daily for 7 days then stop   As he gets further down on that taper I will then resume the oral mesalamine  but try different formulation of Asacol  HD , 2 tablets twice daily  If he has good flare resolution with the prednisone  but then insufficient symptom control with recurrence afterward on Asacol , then we will schedule colonoscopy to assess the activity and extent of his colitis.  If he does not improve as expected in the next 10 to 14 days on prednisone , then colonoscopy sooner.  Labs today: CBC, ESR, CRP, iron studies Await fecal calprotectin level that will give us  his baseline level during a flare.  He will send me a portal message mid next week with an update and we will proceed accordingly.  30 minutes were spent on this encounter (including chart review, history/exam, counseling/coordination of care, and documentation) > 50% of that time was spent on counseling and coordination of care.   Matthew Camacho

## 2024-07-01 NOTE — Telephone Encounter (Signed)
 Spoke with pt. He reports that he is still taking the Prednisone  taper and he is on day 2 of the 20 mg dosing. He reports that for about 3 days while on the 30 mg dosage, his symptoms seemed to improve. Diarrhea 2 times per day, decreased blood and mucus, and decreased urgency. He reports that beginning yesterday with the 20 mg dosage, his symptoms began to flair up again. He is having urgency with multiple diarrhea episodes per day and the blood and mucus have increased. Was able to schedule pt for a sooner f/u appt 07/09/24 at 10:20. Pt would like to know if there is anything else that can be done.

## 2024-07-01 NOTE — Patient Instructions (Addendum)
 Your provider has requested that you go to the basement level for lab work before leaving today. Press B on the elevator. The lab is located at the first door on the left as you exit the elevator.   Thank you for trusting me with your gastrointestinal care!    Dr. Victory Legrand [facility]

## 2024-07-01 NOTE — Telephone Encounter (Signed)
 Called pt. He is available to come in at 4pm today. Appt scheduled.

## 2024-07-02 ENCOUNTER — Ambulatory Visit: Payer: Self-pay | Admitting: Gastroenterology

## 2024-07-02 LAB — IBC + FERRITIN
Ferritin: 66.4 ng/mL (ref 22.0–322.0)
Iron: 57 ug/dL (ref 42–165)
Saturation Ratios: 14.9 % — ABNORMAL LOW (ref 20.0–50.0)
TIBC: 383.6 ug/dL (ref 250.0–450.0)
Transferrin: 274 mg/dL (ref 212.0–360.0)

## 2024-07-02 LAB — C-REACTIVE PROTEIN: CRP: <0.5 mg/dL (ref 0.5–20.0)

## 2024-07-02 LAB — CALPROTECTIN: Calprotectin: 2870 ug/g — ABNORMAL HIGH

## 2024-07-04 ENCOUNTER — Ambulatory Visit: Payer: Self-pay | Admitting: Gastroenterology

## 2024-07-04 DIAGNOSIS — K6389 Other specified diseases of intestine: Secondary | ICD-10-CM

## 2024-07-06 ENCOUNTER — Other Ambulatory Visit: Payer: Self-pay | Admitting: Gastroenterology

## 2024-07-06 DIAGNOSIS — K5130 Ulcerative (chronic) rectosigmoiditis without complications: Secondary | ICD-10-CM

## 2024-07-06 MED ORDER — MESALAMINE 4 G RE ENEM: 4.0000 g | ENEMA | Freq: Every day | RECTAL | 0 refills | Status: DC

## 2024-07-08 ENCOUNTER — Ambulatory Visit: Payer: Commercial Managed Care - PPO | Admitting: Neurology

## 2024-07-09 ENCOUNTER — Ambulatory Visit: Admitting: Gastroenterology

## 2024-07-09 MED ORDER — NA SULFATE-K SULFATE-MG SULF 17.5-3.13-1.6 GM/177ML PO SOLN: 1.0000 | Freq: Once | ORAL | 0 refills | Status: AC

## 2024-07-09 NOTE — Telephone Encounter (Signed)
 Please have him increase his prednisone  back to 40 mg daily, and he needs to be scheduled for a next available colonoscopy with me as soon as possible (definitely within the next 3 weeks).  I expect he can be scheduled through phone/portal messages since he is quite capable of managing the bowel preparation without an in person visit.  Send us  a portal message next Wednesday or Thursday with an update and then I can decide what to do with the prednisone  and send a new prescription if needed.  VEAR Brand MD

## 2024-07-09 NOTE — Telephone Encounter (Signed)
 Spoke with patient and provided your recommendations. Patient is increasing his Predisone to 40 mg/day. He will message us  next Wed/Thur to provide an update. His colonoscopy has been scheduled for 11/20 @ 10:00 AM. Instructions sent and Suprep ordered.

## 2024-07-15 NOTE — Telephone Encounter (Signed)
 Decrease prednisone  to 30 mg once daily for 5 days, 20 mg daily for 5 days, 10 mg daily for 5 days then stop Send a new prescription if he needs additional supply to complete that regimen  He will be seeing me toward the end of next week for a colonoscopy  Complete the 14-day course of mesalamine  enemas when they run out  [employer]

## 2024-07-19 ENCOUNTER — Encounter: Payer: Self-pay | Admitting: Gastroenterology

## 2024-07-19 MED ORDER — PREDNISONE 10 MG PO TABS: 40.0000 mg | ORAL_TABLET | Freq: Every day | ORAL | 0 refills | Status: DC

## 2024-07-19 NOTE — Addendum Note (Signed)
 Addended by: ANITRA ODETTA CROME on: 07/19/2024 09:30 AM   Modules accepted: Orders

## 2024-07-22 ENCOUNTER — Encounter: Payer: Self-pay | Admitting: Gastroenterology

## 2024-07-22 ENCOUNTER — Ambulatory Visit: Admitting: Gastroenterology

## 2024-07-22 VITALS — BP 103/63 | HR 69 | Temp 100.0°F | Resp 19 | Ht 72.0 in | Wt 167.0 lb

## 2024-07-22 DIAGNOSIS — K51519 Left sided colitis with unspecified complications: Secondary | ICD-10-CM

## 2024-07-22 DIAGNOSIS — K625 Hemorrhage of anus and rectum: Secondary | ICD-10-CM

## 2024-07-22 DIAGNOSIS — K529 Noninfective gastroenteritis and colitis, unspecified: Secondary | ICD-10-CM

## 2024-07-22 DIAGNOSIS — K5130 Ulcerative (chronic) rectosigmoiditis without complications: Secondary | ICD-10-CM | POA: Diagnosis present

## 2024-07-22 MED ORDER — MESALAMINE 4 G RE ENEM: 4.0000 g | ENEMA | Freq: Every day | RECTAL | 0 refills | Status: DC

## 2024-07-22 MED ORDER — SODIUM CHLORIDE 0.9 % IV SOLN: 500.0000 mL | Freq: Once | INTRAVENOUS | Status: DC

## 2024-07-22 NOTE — Progress Notes (Signed)
 Pt's states no medical or surgical changes since previsit or office visit.

## 2024-07-22 NOTE — Progress Notes (Signed)
 Called to room to assist during endoscopic procedure.  Patient ID and intended procedure confirmed with present staff. Received instructions for my participation in the procedure from the performing physician.

## 2024-07-22 NOTE — Progress Notes (Signed)
 Patient's history of colitis and recent symptoms are outlined in an office note dated 07/01/2024.  He is on prednisone , dose of which had to be increased during the taper due to recurrence of colitis symptoms. Recent C. difficile negative and calprotectin of 2870  The patient is appropriate for an endoscopic procedure in the ambulatory setting.  - Victory Brand, MD

## 2024-07-22 NOTE — Patient Instructions (Signed)
 YOU HAD AN ENDOSCOPIC PROCEDURE TODAY AT THE [facility]:   Refer to the procedure report that was given to you for any specific questions about what was found during the examination.  If the procedure report does not answer your questions, please call your gastroenterologist to clarify.  If you requested that your care partner not be given the details of your procedure findings, then the procedure report has been included in a sealed envelope for you to review at your convenience later.  YOU SHOULD EXPECT: Some feelings of bloating in the abdomen. Passage of more gas than usual.  Walking can help get rid of the air that was put into your GI tract during the procedure and reduce the bloating. If you had a lower endoscopy (such as a colonoscopy or flexible sigmoidoscopy) you may notice spotting of blood in your stool or on the toilet paper. If you underwent a bowel prep for your procedure, you may not have a normal bowel movement for a few days.  Please Note:  You might notice some irritation and congestion in your nose or some drainage.  This is from the oxygen used during your procedure.  There is no need for concern and it should clear up in a day or so.  SYMPTOMS TO REPORT IMMEDIATELY:  Following lower endoscopy (colonoscopy or flexible sigmoidoscopy):  Excessive amounts of blood in the stool  Significant tenderness or worsening of abdominal pains  Swelling of the abdomen that is new, acute  Fever of 100F or higher   Resume previous diet Complete prednisone  taper as directed Resume mesalamine  enema nightly (new prescription for a 28 day supply)   For urgent or emergent issues, a gastroenterologist can be reached at any hour by calling [phone]. Do not use MyChart messaging for urgent concerns.    DIET:  We do recommend a small meal at first, but then you may proceed to your regular diet.  Drink plenty of fluids but you should avoid alcoholic beverages for 24  hours.  ACTIVITY:  You should plan to take it easy for the rest of today and you should NOT DRIVE or use heavy machinery until tomorrow (because of the sedation medicines used during the test).    FOLLOW UP: Our staff will call the number listed on your records the next business day following your procedure.  We will call around 7:15- 8:00 am to check on you and address any questions or concerns that you may have regarding the information given to you following your procedure. If we do not reach you, we will leave a message.     If any biopsies were taken you will be contacted by phone or by letter within the next 1-3 weeks.  Please call us  at [phone] if you have not heard about the biopsies in 3 weeks.    SIGNATURES/CONFIDENTIALITY: You and/or your care partner have signed paperwork which will be entered into your electronic medical record.  These signatures attest to the fact that that the information above on your After Visit Summary has been reviewed and is understood.  Full responsibility of the confidentiality of this discharge information lies with you and/or your care-partner.

## 2024-07-22 NOTE — Op Note (Signed)
 [facility] Patient Name: Matthew Camacho Procedure Date: 07/22/2024 11:41 AM MRN: [id number] Endoscopist: Victory L. Legrand , MD, [id number] Age: 33 Referring MD:  Date of Birth: [birth year] Gender: Male Account #: [id number] Procedure:                Colonoscopy Indications:              Rectal bleeding, Left-sided chronic ulcerative                            colitis, Disease activity assessment of left-sided                            chronic ulcerative colitis                           See recent office note for clinical details.                           Left-sided ulcerative colitis, recent prolonged                            flare (worst in several years), currently on                            prednisone  20 mg daily. Negative C. difficile.                            Calprotectin 2870 2-3 weeks ago Medicines:                Monitored Anesthesia Care Procedure:                Pre-Anesthesia Assessment:                           - Prior to the procedure, a History and Physical                            was performed, and patient medications and                            allergies were reviewed. The patient's tolerance of                            previous anesthesia was also reviewed. The risks                            and benefits of the procedure and the sedation                            options and risks were discussed with the patient.                            All questions were answered, and informed consent  was obtained. Prior Anticoagulants: The patient has                            taken no anticoagulant or antiplatelet agents. ASA                            Grade Assessment: II - A patient with mild systemic                            disease. After reviewing the risks and benefits,                            the patient was deemed in satisfactory condition to                            undergo the procedure.                            After obtaining informed consent, the colonoscope                            was passed under direct vision. Throughout the                            procedure, the patient's blood pressure, pulse, and                            oxygen saturations were monitored continuously. The                            Olympus CF-HQ190L ([id number]) Colonoscope was                            introduced through the anus and advanced to the the                            terminal ileum, with identification of the                            appendiceal orifice and IC valve. The colonoscopy                            was somewhat difficult due to a redundant colon.                            Successful completion of the procedure was aided by                            using manual pressure and straightening and                            shortening the scope to obtain bowel loop  reduction. The patient tolerated the procedure                            well. The quality of the bowel preparation was                            good. The terminal ileum, ileocecal valve,                            appendiceal orifice, and rectum were photographed. Scope In: 11:49:31 AM Scope Out: 12:09:51 PM Scope Withdrawal Time: 0 hours 14 minutes 56 seconds  Total Procedure Duration: 0 hours 20 minutes 20 seconds  Findings:                 The perianal and digital rectal examinations were                            normal. Specifically, no fistula or other mucosal                            abnormality seen.                           The terminal ileum appeared normal.                           Localized moderate inflammation characterized by                            erosions/shallow ulcer, erythema and granularity                            was found in the proximal ascending colon. Biopsies                            were taken with a cold forceps for histology.                            Similar findings to a lesser degree (primarily some                            mucosal edema and erythema) was also found in the                            proximal transverse and biopsied separately.                           Inflammation characterized by altered vascularity,                            congestion (edema) and erythema was found in a                            continuous and circumferential pattern from the  rectum to the sigmoid colon. The inflammation was                            mild in severity. Biopsies were taken with a cold                            forceps for histology. (Separately from the                            proximal rectum and distal rectum) This activity                            was from the anal verge to the rectosigmoid                            junction, distance of about 12 to 15 cm.                           The exam was otherwise without abnormality. Complications:            No immediate complications. Estimated Blood Loss:     Estimated blood loss was minimal. Impression:               - The examined portion of the ileum was normal.                           - Localized moderate inflammation was found in the                            proximal ascending colon. Biopsied.                           - Proctosigmoid ulcerative colitis. Inflammation                            was found from the rectum to the sigmoid colon.                            This was mild in severity. Biopsied.                           - The examination was otherwise normal. Recommendation:           - Patient has a contact number available for                            emergencies. The signs and symptoms of potential                            delayed complications were discussed with the                            patient. Return to normal activities tomorrow.  Written discharge instructions were provided to the                             patient.                           - Resume previous diet.                           - Complete prednisone  taper as directed                           Resume mesalamine  enema nightly (new prescription                            for 28-day supply)                           Will arrange close clinic follow-up after biopsy                            results for further discussion regarding IBD                            management. Will discuss option of Entyvio Elhadji Pecore L. Legrand, MD 07/22/2024 12:24:27 PM This report has been signed electronically.

## 2024-07-22 NOTE — Progress Notes (Signed)
 Vss nad trans to pacu

## 2024-07-23 ENCOUNTER — Telehealth: Payer: Self-pay | Admitting: *Deleted

## 2024-07-23 NOTE — Telephone Encounter (Signed)
  Follow up Call-     07/22/2024   10:45 AM 06/18/2022    9:28 AM 06/18/2022    9:24 AM  Call back number  Post procedure Call Back phone  # [phone]   Permission to leave phone message Yes  Yes    Left message on machine to call back if any questions or concerns

## 2024-07-26 LAB — SURGICAL PATHOLOGY

## 2024-07-28 ENCOUNTER — Ambulatory Visit: Payer: Self-pay | Admitting: Gastroenterology

## 2024-08-06 ENCOUNTER — Telehealth: Payer: Self-pay

## 2024-08-06 ENCOUNTER — Ambulatory Visit: Admitting: Gastroenterology

## 2024-08-06 ENCOUNTER — Encounter: Payer: Self-pay | Admitting: Gastroenterology

## 2024-08-06 ENCOUNTER — Other Ambulatory Visit

## 2024-08-06 VITALS — BP 110/66 | HR 78 | Ht 72.0 in | Wt 163.0 lb

## 2024-08-06 DIAGNOSIS — K5100 Ulcerative (chronic) pancolitis without complications: Secondary | ICD-10-CM | POA: Diagnosis not present

## 2024-08-06 DIAGNOSIS — R198 Other specified symptoms and signs involving the digestive system and abdomen: Secondary | ICD-10-CM | POA: Diagnosis not present

## 2024-08-06 DIAGNOSIS — K625 Hemorrhage of anus and rectum: Secondary | ICD-10-CM

## 2024-08-06 MED ORDER — MESALAMINE 4 G RE ENEM: 4.0000 g | ENEMA | Freq: Every day | RECTAL | 1 refills | Status: AC

## 2024-08-06 NOTE — Progress Notes (Signed)
 [facility] Progress Note  Chief Complaint: Ulcerative colitis  Subjective  Prior history  Ulcerative proctosigmoiditis (clinical details in 07/01/2024 office note) Patient was having worst flare he had experienced since diagnosis, no anemia, sed rate normal but with calprotectin 2870 and negative C. Difficile  Colonoscopy 07/22/2024 (while tapering off steroids) -still most prominent activity rectosigmoid area, 2 isolated patches as well in the right colon confirmed on biopsies, normal TI   Discussed the use of AI scribe software for clinical note transcription with the patient, who gave verbal consent to proceed.  History of Present Illness Matthew Camacho is a 33 year old male with ulcerative pancolitis who presents for follow-up regarding his colitis management.  Diarrhea and rectal symptoms - Urgent, mucousy diarrhea primarily in the mornings, occurring one to four times daily - Increased frequency to once or twice after lunch - Minimal blood and mucous present at this point - No significant associated pain  Recent gastrointestinal infection - Experienced a 24-hour gastrointestinal illness, suspected norovirus, shortly after recent colonoscopy  Colonoscopy and pathology results reviewed  Current and prior treatments - Using Rowasa  enemas, but finds them unpleasant - Completed a course of steroids as of Monday prior to visit - Taking oral mesalamine  nightly, not currently using mesalamine  suppository ordered oral therapy  Associated gastrointestinal symptoms - No significant abdominal pain - No heartburn - No trouble swallowing - No nausea or vomiting    ROS: Cardiovascular:  no chest pain Respiratory: no dyspnea  The patient's Past Medical, Family and Social History were reviewed and are on file in the EMR. Past Medical History:  Diagnosis Date   Colitis    Vitiligo     Past Surgical History:  Procedure Laterality Date   INGUINAL HERNIA REPAIR Left     KNEE SURGERY Right    VASECTOMY  06/2024     Objective:  Med list reviewed  Current Outpatient Medications:    hyoscyamine  (LEVSIN  SL) 0.125 MG SL tablet, Place 1 tablet (0.125 mg total) under the tongue every 6 (six) hours as needed., Disp: 45 tablet, Rfl: 1   mesalamine  (APRISO ) 0.375 g 24 hr capsule, Take 2 capsules (0.75 g total) by mouth in the morning and at bedtime., Disp: 360 capsule, Rfl: 3   diazepam (VALIUM) 10 MG tablet, SMARTSIG:1-2 Tablet(s) By Mouth (Patient not taking: Reported on 07/01/2024), Disp: , Rfl:    DULoxetine  (CYMBALTA ) 30 MG capsule, Take 1 capsule by mouth daily. (Patient not taking: Reported on 08/06/2024), Disp: , Rfl:    mesalamine  (CANASA ) 1000 MG suppository, USE 1 SUPPOSITORY RECTALLY AT BEDTIME (Patient not taking: Reported on 08/06/2024), Disp: 90 suppository, Rfl: 3   mesalamine  (ROWASA ) 4 g enema, Place 60 mLs (4 g total) rectally at bedtime., Disp: 1680 mL, Rfl: 1   oxyCODONE (OXY IR/ROXICODONE) 5 MG immediate release tablet, Take 5 mg by mouth every 4 (four) hours as needed. (Patient not taking: Reported on 07/01/2024), Disp: , Rfl:    predniSONE  (DELTASONE ) 10 MG tablet, Take 4 tablets (40 mg total) by mouth daily with breakfast. 40 mg daily x 7 days,30mg  daily 7 days,20mg  daily 7 days, 10mg  daily 7 days (Patient not taking: Reported on 08/06/2024), Disp: 30 tablet, Rfl: 0   Vital signs in last 24 hrs: Vitals:   08/06/24 1415  BP: 110/66  Pulse: 78   Wt Readings from Last 3 Encounters:  08/06/24 163 lb (73.9 kg)  07/22/24 167 lb (75.8 kg)  07/01/24 167 lb (75.8 kg)  Physical Exam  Looks well No additional exam Entire visit spent in review of symptoms results and plan  Labs:   ___________________________________________ Radiologic studies:   ____________________________________________ Other:    1. Surgical [P], colon, ascending-inflammation and ulcers seen on exam :      - CHRONIC ACTIVE COLITIS WITH MILD ACTIVITY AND  REACTIVE EPITHELIAL CHANGES.      - NEGATIVE FOR DYSPLASIA.       2. Surgical [P], colon, proximal transverse-inflammation and ulcers seen on exam :      - CHRONIC ACTIVE COLITIS WITH MILD ACTIVITY.      - NEGATIVE FOR DYSPLASIA.       3. Surgical [P], colon, proximal rectum-inflammation and ulcers seen on exam :      - CHRONIC ACTIVE COLITIS WITH MILD ACTIVITY.      - NEGATIVE FOR DYSPLASIA.       4. Surgical [P], colon, distal rectum-inflammation and ulcers seen on exam :      - CHRONIC ACTIVE COLITIS WITH MODERATE ACTIVITY.      - NEGATIVE FOR DYSPLASIA.  _____________________________________________   Encounter Diagnoses  Name Primary?   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 Current treatment insufficient.  He wishes to start Entyvio for its gut-specific action and efficacy in ulcerative pancolitis. Lower systemic infection risk than systemic biologics.- Initiated Entyvio IV infusion at week 0, 2, and 6, then every 8 weeks. - Consider conversion to subcutaneous form post-induction if effective. - Coordinate with infusion center pharmacy for insurance prior authorization. - Taper Rowasa  enemas after starting Entyvio, reducing frequency over time. - Will provide information on Entyvio side effects and risks via chart message. He had already done some reading online after our postcolonoscopy conversation, and he feels comfortable starting the medicine at this point.  Lab today for hepatitis B testing and TB QuantiFERON gold  Arvella will send me a portal message after his second infusion and then we can plan tapering of his Rowasa  enemas and clinic follow-up.  If he flares in the interim, we can try to get him through that with colonic release budesonide (Uceris/Ortikos) if obtainable based on insurance coverage  23 minutes were  spent on this encounter (including chart review, history/exam, counseling/coordination of care, and documentation) > 50% of that time was spent on counseling and coordination of care.   Victory LITTIE Brand III

## 2024-08-06 NOTE — Patient Instructions (Addendum)
 Your provider has requested that you go to the basement level for lab work before leaving today. Press B on the elevator. The lab is located at the first door on the left as you exit the elevator.   Thank you for trusting me with your gastrointestinal care!    Dr. Victory Brand III [facility]   _______________________  What is Entyvio? Entyvio (vedolizumab) is a medication used to treat moderately to severely active ulcerative colitis and Crohn's disease in adults. It is given either as an infusion into a vein (IV) by a healthcare provider or as an injection under the skin that you can give yourself after training.[1] Common Side Effects The most common side effects that occur in patients taking Entyvio include:[1] Cold symptoms (runny nose, sore throat) Headache Joint pain Nausea Fever Upper respiratory infections (colds) Tiredness Cough Bronchitis Flu Back pain Skin rash or itching Sinus infections Pain in arms or legs If you receive Entyvio as an injection under the skin, you may also experience reactions at the injection site.[1] Most of these side effects are mild and go away on their own. However, you should tell your healthcare provider if any side effect bothers you or does not go away. Serious Side Effects and Risks Allergic and Infusion Reactions Some patients may have allergic reactions or infusion-related reactions during or after receiving Entyvio. These reactions can happen with the first dose or later doses. Signs of a serious allergic reaction include:[1] Difficulty breathing or wheezing Hives or skin rash Flushing (redness of the face) Increased blood pressure or heart rate Swelling of the face, lips, or throat Tell your healthcare provider right away if you experience any of these symptoms during or after your infusion. In rare cases, severe allergic reactions (anaphylaxis) can occur and require immediate medical attention.[1] Infections Entyvio  can increase your risk of getting infections because it affects your immune system. Common infections include colds, sinus infections, and urinary tract infections. More serious infections have also been reported, including:[1] Anal abscess (especially in Crohn's disease patients) Sepsis (a severe blood infection that can be life-threatening) Tuberculosis Infections of the intestines Contact your healthcare provider right away if you develop signs of infection, such as: Fever Chills Cough that won't go away Flu-like symptoms Painful urination Unusual vaginal discharge or irritation Do not take Entyvio if you have an active, severe infection until the infection is controlled.[1] Progressive Multifocal Leukoencephalopathy (PML) PML is a rare but serious brain infection that can lead to death or severe disability. While PML has been reported with similar medications, it is extremely rare with Entyvio. In fact, only one case has been confirmed in over 470,000 person-years of use, and that patient had other risk factors including HIV infection and a weakened immune system.[1][3] Tell your healthcare provider immediately if you experience any new or worsening neurological symptoms, such as:[1] Confusion or problems thinking Memory loss Changes in vision Difficulty walking or talking Weakness on one side of the body Liver Problems Some patients taking Entyvio have developed liver problems. Contact your healthcare provider right away if you notice:[1] Unusual tiredness Loss of appetite Pain in the upper right side of your stomach Dark urine Yellowing of the skin or eyes (jaundice) Important Safety Information Tell your healthcare provider about all medical conditions you have, especially if you have an infection or a history of recurring infections.[1] Tell your healthcare provider about all medications you take, including prescription and over-the-counter medicines, vitamins, and herbal  supplements. If you are pregnant, planning  to become pregnant, or breastfeeding, discuss this with your healthcare provider. Make sure you are up to date on all vaccinations before starting Entyvio, as recommended by your healthcare provider. When to Seek Medical Help Contact your healthcare provider or seek emergency medical care if you experience: Signs of a severe allergic reaction Symptoms of a serious infection New or worsening neurological symptoms Signs of liver problems Any other symptoms that concern you

## 2024-08-06 NOTE — Telephone Encounter (Signed)
 Noted

## 2024-08-06 NOTE — Telephone Encounter (Signed)
-----   Message from Victory LITTIE Brand III sent at 08/06/2024  4:51 PM EST ----- RICK, this patient was seen in clinic today for ulcerative colitis, and orders were placed to the [address] [facility] to begin Entyvio infusions.  VEAR Brand MD

## 2024-08-08 ENCOUNTER — Ambulatory Visit: Payer: Self-pay | Admitting: Gastroenterology

## 2024-08-09 ENCOUNTER — Other Ambulatory Visit (HOSPITAL_COMMUNITY): Payer: Self-pay | Admitting: Pharmacist

## 2024-08-09 LAB — HEPATITIS B SURFACE ANTIGEN: Hepatitis B Surface Ag: NONREACTIVE

## 2024-08-09 LAB — HEPATITIS B CORE ANTIBODY, TOTAL: Hep B Core Total Ab: NONREACTIVE

## 2024-08-09 LAB — QUANTIFERON-TB GOLD PLUS
Mitogen-NIL: 6.75 [IU]/mL
NIL: 0.03 [IU]/mL
QuantiFERON-TB Gold Plus: NEGATIVE
TB1-NIL: 0 [IU]/mL
TB2-NIL: 0 [IU]/mL

## 2024-08-09 LAB — HEPATITIS B SURFACE ANTIBODY, QUANTITATIVE: Hep B S AB Quant (Post): <5 m[IU]/mL — ABNORMAL LOW (ref >=10)

## 2024-08-10 ENCOUNTER — Encounter (HOSPITAL_COMMUNITY): Payer: Self-pay | Admitting: Gastroenterology

## 2024-08-11 ENCOUNTER — Telehealth (HOSPITAL_COMMUNITY): Payer: Self-pay | Admitting: Pharmacy Technician

## 2024-08-11 ENCOUNTER — Telehealth (HOSPITAL_COMMUNITY): Payer: Self-pay | Admitting: Pharmacist

## 2024-08-11 ENCOUNTER — Encounter (HOSPITAL_COMMUNITY): Payer: Self-pay | Admitting: Gastroenterology

## 2024-08-11 NOTE — Telephone Encounter (Signed)
-----   Message from Victory LITTIE Brand III sent at 08/11/2024 11:04 AM EST ----- Regarding: RE: Entyvio Orders Thanks for checking.  I would like to see what clinical response he has to the IV Entyvio before we make a decision about whether or not to change to the subcutaneous form.  Victory Brand, [facility] ----- Message ----- From: Dayne Sherry RAMAN, RPH-CPP Sent: 08/11/2024   9:30 AM EST To: Victory LITTIE Brand DOUGLAS, MD Subject: Allene Orders                                 Hi Dr. Brand,  We are working on authorization for this patient's Entyvio infusions. I wanted to confirm that you want him on IV until follow-up with you. Your note indicated possible switch to SQ in future if responsive to treatment. Would you want our team to work on authorization for SQ formulation now or will you let us  know once you'd like to make the switch?  Thanks!  Sherry Dayne, PharmD, MPH, BCPS, CPP Clinical Pharmacist

## 2024-08-11 NOTE — Telephone Encounter (Signed)
 Auth Submission: {approved/denied:[id number]} Site of care: [facility] Payer: [employer] Medication & CPT/J Code(s) submitted: Entyvio (Vedolizumab) [id number] Diagnosis Code: K52.9, K51.00 Route of submission (phone, fax, portal): [employer] portal Phone # Fax # Auth type: Buy/Bill HB Units/visits requested: 300MG  EVERY 2 WEEKS X 2 DOSES, EVERY 4 WEEKS X 1 DOSE, THEN EVERY 8 WEEKS Reference number: [id number] Approval from: *** to ***    Dagoberto Armour, CPhT [facility] Phone: [phone] 08/11/2024

## 2024-08-17 ENCOUNTER — Ambulatory Visit: Admitting: Gastroenterology

## 2024-09-01 NOTE — Telephone Encounter (Signed)
 First 2 weeks of Feb for clinic follow up.  Looks like I have plenty of slots open.  - HD

## 2024-09-06 ENCOUNTER — Encounter (HOSPITAL_COMMUNITY)
Admission: RE | Admit: 2024-09-06 | Discharge: 2024-09-06 | Disposition: A | Discharge status: Home / Self Care | Source: Ambulatory Visit | Attending: Gastroenterology | Admitting: Gastroenterology

## 2024-09-06 VITALS — BP 102/66 | HR 65 | Temp 98.2°F | Resp 15

## 2024-09-06 DIAGNOSIS — K5100 Ulcerative (chronic) pancolitis without complications: Secondary | ICD-10-CM | POA: Diagnosis not present

## 2024-09-06 DIAGNOSIS — K529 Noninfective gastroenteritis and colitis, unspecified: Secondary | ICD-10-CM | POA: Diagnosis present

## 2024-09-06 MED ORDER — VEDOLIZUMAB 300 MG IV SOLR
300.0000 mg | Freq: Once | INTRAVENOUS | Status: AC
  Administered 2024-09-06: 300 mg via INTRAVENOUS
  Filled 2024-09-06: qty 5

## 2024-09-06 MED ORDER — SODIUM CHLORIDE 0.9 % IV SOLN: INTRAVENOUS | Status: DC

## 2024-09-09 ENCOUNTER — Encounter: Payer: Self-pay | Admitting: Gastroenterology

## 2024-09-20 ENCOUNTER — Encounter (HOSPITAL_COMMUNITY)
Admission: RE | Admit: 2024-09-20 | Discharge: 2024-09-20 | Disposition: A | Source: Ambulatory Visit | Attending: Gastroenterology

## 2024-09-20 VITALS — BP 105/63 | HR 65 | Temp 97.5°F | Resp 15

## 2024-09-20 DIAGNOSIS — K529 Noninfective gastroenteritis and colitis, unspecified: Secondary | ICD-10-CM

## 2024-09-20 DIAGNOSIS — K5100 Ulcerative (chronic) pancolitis without complications: Secondary | ICD-10-CM

## 2024-09-20 MED ORDER — SODIUM CHLORIDE 0.9 % IV SOLN: INTRAVENOUS | Status: DC

## 2024-09-20 MED ORDER — VEDOLIZUMAB 300 MG IV SOLR
300.0000 mg | Freq: Once | INTRAVENOUS | Status: AC
  Administered 2024-09-20: 300 mg via INTRAVENOUS
  Filled 2024-09-20: qty 5

## 2024-09-30 NOTE — Progress Notes (Signed)
 "      GI Progress Note  Chief Complaint: Ulcerative colitis  Subjective  Prior history  Left-sided ulcerative colitis, clinical details and multiple prior notes.  Was under good control on mesalamine  until flaring through that mid-to-late 2025.  Began Entyvio  early January 2026   Discussed the use of AI scribe software for clinical note transcription with the patient, who gave verbal consent to proceed.  History of Present Illness Matthew Camacho is a 34 year old male with ulcerative chronic pancolitis who presents for follow-up of persistent symptoms despite ongoing therapy.  Bowel Habit Changes and Gastrointestinal Symptoms: - Persistent bowel urgency and increased frequency, with two to three bowel movements within the first two hours of the day - First bowel movement of the day is urgent and difficult - Subsequent bowel movements are more solid and cleaner - No blood or mucus in stool for the past four to five days - Symptoms improve for the remainder of the day after the initial morning episodes  Therapeutic Response and Medication Tolerance: - Current therapy includes mesalamine  enemas and Apriso  (oral mesalamine ) with minimal improvement - Has received a couple of Entyvio  infusions - Hyoscyamine  not in use due to lack of perceived benefit - Mild fatigue following Entyvio  infusions, with no other adverse reactions  Recent Infectious Illness: - Recent household influenza outbreak with fevers and respiratory symptoms in family members - Only he experienced gastrointestinal symptoms during the illness, including cramps and diarrhea - No associated nausea or vomiting - Gastrointestinal symptoms were worse during the illness but have since resolved  Functional Impact and Fatigue: - Continues to work, though ongoing symptoms are taxing and draining - Occasional late workdays, but able to manage responsibilities - Cares for two young children at home - Stress and  inconsistent sleep contribute to overall fatigue - Sleep duration typically six or more hours per night, but not consistently seven to eight hours    ROS: Cardiovascular:  no chest pain Respiratory: no dyspnea  Vitiligo, no rash Has not been experiencing reactions to the Entyvio   The patient's Past Medical, Family and Social History were reviewed and are on file in the EMR. Past Medical History:  Diagnosis Date   Colitis    Vitiligo     Past Surgical History:  Procedure Laterality Date   FRACTURE SURGERY  10/11/2022   INGUINAL HERNIA REPAIR Left    KNEE SURGERY Right    VASECTOMY  06/2024     Objective:  Med list reviewed Current Medications[1]   Vital signs in last 24 hrs: Vitals:   10/01/24 1022  BP: 110/60  Pulse: 81   Wt Readings from Last 3 Encounters:  10/01/24 164 lb (74.4 kg)  08/06/24 163 lb (73.9 kg)  07/22/24 167 lb (75.8 kg)    Physical Exam  Well-appearing HEENT: sclera anicteric, oral mucosa moist without lesions Neck: supple, no thyromegaly, JVD or lymphadenopathy Cardiac: Regular without appreciable murmur,  no peripheral edema Pulm: clear to auscultation bilaterally, normal RR and effort noted Abdomen: soft, no focal tenderness, with active bowel sounds. No guarding or palpable hepatosplenomegaly. Skin; warm and dry, no jaundice or rash, + vitiligo   Labs:   ___________________________________________ Radiologic studies:   ____________________________________________ Other:   _____________________________________________   Encounter Diagnoses  Name Primary?   Ulcerative chronic pancolitis without complications (HCC) Yes   Chronic diarrhea     Assessment & Plan Ulcerative chronic pancolitis Symptoms persist with urgency and increased frequency. Induction phase of Entyvio  with anticipated gradual  response. Oral mesalamine  not likely adding much at this point.  Questionable improvement with the mesalamine  enema . Mild fatigue  present. Objective inflammation assessment planned. - Discontinued oral mesalamine  (Apriso ) and rectal mesalamine  enemas (Rowasa ). - Advised monitoring for recurrence of rectal bleeding or worsening urgency/frequency after cessation of mesalamine , and to report significant changes. - Initiated hyoscyamine  0.125 mg SL every morning, with option to increase to two tablets if insufficient response. - Ordered fecal calprotectin in several weeks (about 2 weeks after his upcoming treatment) to objectively assess inflammation and response to therapy. - Provided anticipatory guidance regarding the expected gradual response to Entyvio  during induction phase.    Victory LITTIE Brand III     [1]  Current Outpatient Medications:    mesalamine  (APRISO ) 0.375 g 24 hr capsule, Take 2 capsules (0.75 g total) by mouth in the morning and at bedtime., Disp: 360 capsule, Rfl: 3   mesalamine  (ROWASA ) 4 g enema, Place 60 mLs (4 g total) rectally at bedtime., Disp: 1680 mL, Rfl: 1  "

## 2024-10-01 ENCOUNTER — Encounter: Payer: Self-pay | Admitting: Gastroenterology

## 2024-10-01 ENCOUNTER — Ambulatory Visit: Admitting: Gastroenterology

## 2024-10-01 VITALS — BP 110/60 | HR 81 | Ht 72.0 in | Wt 164.0 lb

## 2024-10-01 DIAGNOSIS — K529 Noninfective gastroenteritis and colitis, unspecified: Secondary | ICD-10-CM | POA: Diagnosis not present

## 2024-10-01 DIAGNOSIS — K5100 Ulcerative (chronic) pancolitis without complications: Secondary | ICD-10-CM

## 2024-10-01 NOTE — Patient Instructions (Signed)
 Your provider has requested that you go to the basement level for lab work before leaving today. Press B on the elevator. The lab is located at the first door on the left as you exit the elevator.   Thank you for trusting me with your gastrointestinal care!    Dr. Victory Legrand [facility]

## 2024-10-18 ENCOUNTER — Encounter (HOSPITAL_COMMUNITY)
